# Patient Record
Sex: Female | Born: 1970 | Race: White | Hispanic: No | Marital: Married | Smoking: Former smoker
Health system: Southern US, Community
[De-identification: ages and names within clinical notes are randomized; demographics above are authoritative.]

## PROBLEM LIST (undated history)

## (undated) DIAGNOSIS — J4520 Mild intermittent asthma, uncomplicated: Secondary | ICD-10-CM

## (undated) DIAGNOSIS — F419 Anxiety disorder, unspecified: Secondary | ICD-10-CM

## (undated) HISTORY — DX: Mild intermittent asthma, uncomplicated: J45.20

## (undated) HISTORY — DX: Anxiety disorder, unspecified: F41.9

---

## 2001-03-23 ENCOUNTER — Encounter: Payer: Self-pay | Admitting: Family Medicine

## 2001-03-23 LAB — CONVERTED CEMR LAB

## 2003-11-24 HISTORY — PX: ABDOMINAL HYSTERECTOMY: SHX81

## 2004-11-28 ENCOUNTER — Ambulatory Visit: Payer: Self-pay | Admitting: Family Medicine

## 2004-12-19 ENCOUNTER — Ambulatory Visit: Payer: Self-pay | Admitting: Family Medicine

## 2005-09-15 ENCOUNTER — Ambulatory Visit: Payer: Self-pay | Admitting: Professional

## 2005-09-22 ENCOUNTER — Ambulatory Visit: Payer: Self-pay | Admitting: Professional

## 2006-03-18 ENCOUNTER — Ambulatory Visit: Payer: Self-pay | Admitting: Family Medicine

## 2006-04-08 ENCOUNTER — Ambulatory Visit: Payer: Self-pay | Admitting: Obstetrics & Gynecology

## 2006-11-30 ENCOUNTER — Ambulatory Visit: Payer: Self-pay | Admitting: Family Medicine

## 2006-12-02 ENCOUNTER — Encounter: Payer: Self-pay | Admitting: Family Medicine

## 2006-12-02 LAB — CONVERTED CEMR LAB: Glucose, Bld: 85 mg/dL (ref 70–99)

## 2007-12-01 ENCOUNTER — Encounter: Payer: Self-pay | Admitting: Family Medicine

## 2007-12-01 DIAGNOSIS — F411 Generalized anxiety disorder: Secondary | ICD-10-CM | POA: Insufficient documentation

## 2007-12-01 DIAGNOSIS — N3090 Cystitis, unspecified without hematuria: Secondary | ICD-10-CM

## 2007-12-01 DIAGNOSIS — N390 Urinary tract infection, site not specified: Secondary | ICD-10-CM | POA: Insufficient documentation

## 2007-12-02 ENCOUNTER — Ambulatory Visit: Payer: Self-pay | Admitting: Family Medicine

## 2008-02-29 ENCOUNTER — Ambulatory Visit: Payer: Self-pay | Admitting: Family Medicine

## 2008-03-28 ENCOUNTER — Encounter: Payer: Self-pay | Admitting: Family Medicine

## 2008-08-02 ENCOUNTER — Ambulatory Visit: Payer: Self-pay | Admitting: Gynecology

## 2008-09-03 ENCOUNTER — Ambulatory Visit: Payer: Self-pay | Admitting: Family Medicine

## 2008-10-11 ENCOUNTER — Telehealth: Payer: Self-pay | Admitting: Family Medicine

## 2008-10-12 ENCOUNTER — Ambulatory Visit: Payer: Self-pay | Admitting: Family Medicine

## 2008-12-10 ENCOUNTER — Ambulatory Visit: Payer: Self-pay | Admitting: Family Medicine

## 2009-07-08 ENCOUNTER — Ambulatory Visit: Payer: Self-pay | Admitting: Family Medicine

## 2009-08-06 ENCOUNTER — Emergency Department: Payer: Self-pay | Admitting: Unknown Physician Specialty

## 2010-08-11 ENCOUNTER — Encounter: Payer: Self-pay | Admitting: Family Medicine

## 2010-12-23 NOTE — Letter (Signed)
Summary: [facility] [employer]  [employer] [employer]   Imported By: Lanelle Bal 08/19/2010 08:40:46  _____________________________________________________________________  External Attachment:    Type:   Image     Comment:   External Document

## 2011-04-07 NOTE — Assessment & Plan Note (Signed)
NAME:  Rachel Caldwell, Rachel Caldwell NO.:  [id number]   MEDICAL RECORD NO.:  [id number]          PATIENT TYPE:  POB   LOCATION:  [facility] at [facility]         FACILITY:  [facility]   PHYSICIAN:  Ginger Carne, MD DATE OF BIRTH:  06-23-1971   DATE OF SERVICE:                                  CLINIC NOTE   This patient is here today because of complaint maculopapular irritation  and lesions on her lower pelvic area and vulva following using razor  blade to shave down in that region.  The patient is a 40 year old  Caucasian female who typically uses a razor for said hygiene management.   SALIENT PHYSICAL FINDINGS:  The patient demonstrates multiple areas of  papular lesions consistent with folliculitis in the pelvic and vulvar  areas.   IMPRESSION AND PLAN:  Folliculitis.   PLAN:  Bactrim double strength 1 twice a day #20.  The patient was  advised to return if these symptoms disappear.  There is no suggestion  or evidence for herpes simplex virus.           ______________________________  Ginger Carne, MD     SHB/MEDQ  D:  08/02/2008  T:  08/03/2008  Job:  [id number]

## 2011-04-10 NOTE — Group Therapy Note (Signed)
NAME:  Rachel Caldwell, Rachel Caldwell NO.:  [id number]   MEDICAL RECORD NO.:  [id number]          PATIENT TYPE:  WOC   LOCATION:  [facility]                   FACILITY:  [facility]   PHYSICIAN:  Tinnie Gens, MD        DATE OF BIRTH:  Sep 13, 1971   DATE OF SERVICE:  04/08/2006                                    CLINIC NOTE   CHIEF COMPLAINT:  Lesions on vulva.   HISTORY OF PRESENT ILLNESS:  The patient is a 40 year old nulligravid  patient, who is status post TVH for menorrhagia in June 2005. The patient  apparently split up with her husband approximately 3 months ago and  developed some lesions on her vulvar area. She was seen by Dr. Rosezena Sensor,  who sent her here for further evaluation and possible treatment. She reports  that the lesions are not painful. There is no irritation involved. They do  not itch. Some of the lesions have kind of gone away. She did notice a white  spot in one of them that she pulled out and then the lesion kind of went  away after that.   PHYSICAL EXAMINATION:  VITAL SIGNS:  Blood pressure 119/85, weight 126,  pulse of 76.  GENERAL:  She is a well-developed, well-nourished white female in no acute  distress.  ABDOMEN:  Soft, nontender, and nondistended.  GENITOURINARY:  All along the vulva are multiple papules. Some with dimples.  They are mildly erythematous. Some are larger than others and they are in  various stages of healing.   IMPRESSION:  Molluscum contagiosum.   PLAN:  Given that the patient is not immunosuppressed and these lesions are  not bothering her, would not treat them at this time. Molluscum almost  always is a self-limited disease and will go away by itself. I have  scheduled her followup in 3 months to be sure that they are going in the  right direction. Reviewed safe sex practices.           ______________________________  Tinnie Gens, MD     TP/MEDQ  D:  04/08/2006  T:  04/09/2006  Job:  [id number]

## 2014-05-11 DIAGNOSIS — J4520 Mild intermittent asthma, uncomplicated: Secondary | ICD-10-CM | POA: Insufficient documentation

## 2015-08-15 DIAGNOSIS — M7541 Impingement syndrome of right shoulder: Secondary | ICD-10-CM | POA: Insufficient documentation

## 2015-11-04 ENCOUNTER — Encounter: Payer: Self-pay | Admitting: Family Medicine

## 2015-11-04 ENCOUNTER — Ambulatory Visit (INDEPENDENT_AMBULATORY_CARE_PROVIDER_SITE_OTHER): Payer: BLUE CROSS/BLUE SHIELD | Admitting: Family Medicine

## 2015-11-04 VITALS — BP 102/68 | HR 96 | Temp 99.0°F | Resp 15 | Ht 68.0 in | Wt 135.9 lb

## 2015-11-04 DIAGNOSIS — F411 Generalized anxiety disorder: Secondary | ICD-10-CM

## 2015-11-04 MED ORDER — ALPRAZOLAM 0.25 MG PO TABS: 0.2500 mg | ORAL_TABLET | Freq: Every day | ORAL | Status: DC | PRN

## 2015-11-04 MED ORDER — CITALOPRAM HYDROBROMIDE 10 MG PO TABS: 10.0000 mg | ORAL_TABLET | Freq: Every day | ORAL | Status: DC

## 2015-11-04 NOTE — Progress Notes (Signed)
Name: Rachel Caldwell   MRN: [id number]    DOB: October 01, 1971   Date:11/04/2015       Progress Note  Subjective  Chief Complaint  Chief Complaint  Patient presents with  . Establish Care    NP  . Anxiety    Anxiety Presents for initial visit. The problem has been rapidly worsening. Symptoms include chest pain, excessive worry, insomnia, irritability, muscle tension, nervous/anxious behavior, panic and shortness of breath. Patient reports no depressed mood, palpitations or suicidal ideas. Primary symptoms comment: Symptoms described as being 'uptight, trouble relaxing, feeling overwhelmed'. Symptoms occur most days. The severity of symptoms is moderate.   Her past medical history is significant for anxiety/panic attacks. Past treatments include SSRIs (In the past, has been on Lexapro and Paxil. ). Compliance with prior treatments: Was prescribed Lexapro by PCP in past but never started taking.   Past Medical History  Diagnosis Date  . Anxiety     Past Surgical History  Procedure Laterality Date  . Abdominal hysterectomy  2005    History reviewed. No pertinent family history.  Social History   Social History  . Marital Status: Single    Spouse Name: N/A  . Number of Children: N/A  . Years of Education: N/A   Occupational History  . Not on file.   Social History Main Topics  . Smoking status: Never Smoker   . Smokeless tobacco: Never Used  . Alcohol Use: No  . Drug Use: No  . Sexual Activity: No   Other Topics Concern  . Not on file   Social History Narrative  . No narrative on file     Current outpatient prescriptions:  .  albuterol (PROVENTIL HFA;VENTOLIN HFA) 108 (90 BASE) MCG/ACT inhaler, Inhale into the lungs., Disp: , Rfl:  .  nitrofurantoin (MACRODANTIN) 100 MG capsule, Take 100 mg by mouth as needed., Disp: , Rfl:   No Known Allergies   Review of Systems  Constitutional: Positive for irritability.  Eyes: Negative for blurred vision and double vision.   Respiratory: Positive for shortness of breath.   Cardiovascular: Positive for chest pain. Negative for palpitations.  Gastrointestinal: Negative for abdominal pain.  Neurological: Negative for headaches.  Psychiatric/Behavioral: Negative for depression, suicidal ideas and substance abuse. The patient is nervous/anxious and has insomnia.     Objective  Filed Vitals:   11/04/15 0851  BP: 102/68  Pulse: 96  Temp: 99 F (37.2 C)  TempSrc: Oral  Resp: 15  Height: 5\' 8"  (1.727 m)  Weight: 135 lb 14.4 oz (61.644 kg)  SpO2: 98%    Physical Exam  Constitutional: She is oriented to person, place, and time and well-developed, well-nourished, and in no distress.  Cardiovascular: Normal rate, regular rhythm and normal heart sounds.   No murmur heard. Pulmonary/Chest: Effort normal and breath sounds normal. She has no wheezes.  Abdominal: Soft. Bowel sounds are normal. There is no tenderness.  Neurological: She is alert and oriented to person, place, and time.  Psychiatric: Mood, memory, affect and judgment normal.  Nursing note and vitals reviewed.   Assessment & Plan  1. Generalized anxiety disorder We will start on SSRI and benzodiazepine therapy. Advised of potential side effects and dependence potential of benzodiazepines. Recheck in 6 weeks. - citalopram (CELEXA) 10 MG tablet; Take 1 tablet (10 mg total) by mouth daily.  Dispense: 30 tablet; Refill: 2 - ALPRAZolam (XANAX) 0.25 MG tablet; Take 1 tablet (0.25 mg total) by mouth daily as needed for anxiety.  Dispense:  30 tablet; Refill: 2   Jacobie Stamey [facility] 11/04/2015 9:27 AM

## 2015-12-05 ENCOUNTER — Ambulatory Visit: Payer: BLUE CROSS/BLUE SHIELD | Admitting: Family Medicine

## 2016-01-03 ENCOUNTER — Telehealth: Payer: Self-pay | Admitting: Family Medicine

## 2016-01-03 NOTE — Telephone Encounter (Signed)
SHE DOES NOT NEED REFILLS FOR SHE CAN GET THIS RX BUT WILL ONLY GET IT FOR 30 DAYS AND HAS TO PAY 10.00 MORE DOLLARS THAN SHE USUALLY HAS TO PAY FOR 90 DAY SUPPLY. HER INSURACNE COMPANY JUST ASK THAT THE RX BE WRITTEN FOR 90 DAYS INSTEAD OF 30 DAYS. SHE STILL HAS REFILLS BUT JUST NEEDS IT TO BE FOR 90 DAYS.  DOES SHE HAVE TO WAIT TILL HER NEXT APPT BEFORE THIS CAN BE CHANGED?

## 2016-01-03 NOTE — Telephone Encounter (Signed)
lft mess on cell to make appt . Per Dr this has to be done before rx can be changed to 90 day supply.

## 2016-01-03 NOTE — Telephone Encounter (Signed)
Pt said that she already has appt on 01-13-16 and she will just wait till then

## 2016-01-03 NOTE — Telephone Encounter (Signed)
CELEXA FOR HER ANXIETY THE PHARM ([facility] ON [address]) SAYS THAT THE INS IS REQUESTING A 90 DAY SUPPLY.

## 2016-01-03 NOTE — Telephone Encounter (Signed)
Pt was a new pt and has only seen Manuella Ghazi once. Pt was supposed to come back in 6 weeks (12/05/15) for follow up so pt needs to make appt for refills.

## 2016-01-03 NOTE — Telephone Encounter (Signed)
Yes she does

## 2016-01-13 ENCOUNTER — Encounter: Payer: Self-pay | Admitting: Family Medicine

## 2016-01-13 ENCOUNTER — Ambulatory Visit (INDEPENDENT_AMBULATORY_CARE_PROVIDER_SITE_OTHER): Payer: BLUE CROSS/BLUE SHIELD | Admitting: Family Medicine

## 2016-01-13 VITALS — BP 100/78 | HR 89 | Temp 99.3°F | Resp 19 | Ht 68.0 in | Wt 135.1 lb

## 2016-01-13 DIAGNOSIS — F411 Generalized anxiety disorder: Secondary | ICD-10-CM | POA: Diagnosis not present

## 2016-01-13 DIAGNOSIS — Z23 Encounter for immunization: Secondary | ICD-10-CM | POA: Diagnosis not present

## 2016-01-13 MED ORDER — CITALOPRAM HYDROBROMIDE 10 MG PO TABS: 10.0000 mg | ORAL_TABLET | Freq: Every day | ORAL | Status: DC

## 2016-01-13 MED ORDER — ALPRAZOLAM 0.5 MG PO TABS: 0.5000 mg | ORAL_TABLET | Freq: Every day | ORAL | Status: DC | PRN

## 2016-01-13 NOTE — Progress Notes (Signed)
Name: Rachel Caldwell   MRN: [id number]    DOB: [birth year]   Date:01/13/2016       Progress Note  Subjective  Chief Complaint  Chief Complaint  Patient presents with  . Follow-up    1 mo  . Anxiety  . Medication Refill    xanax / celexa 10 mg     Anxiety Presents for initial visit. The problem has been gradually improving. Symptoms include chest pain, excessive worry, insomnia, irritability, muscle tension, nervous/anxious behavior, panic and shortness of breath. Patient reports no depressed mood, palpitations or suicidal ideas. Symptoms occur most days. The severity of symptoms is moderate.   Her past medical history is significant for anxiety/panic attacks. Past treatments include SSRIs (In the past, has been on Lexapro and Paxil. ). The treatment provided moderate (Still experiences breakthrough symptoms of anxiety but thinks 'thats just me') relief. Compliance with prior treatments has been good (Was prescribed Lexapro by PCP in past but never started taking).      Past Medical History  Diagnosis Date  . Anxiety     Past Surgical History  Procedure Laterality Date  . Abdominal hysterectomy  2005    Family History  Problem Relation Age of Onset  . Healthy Mother   . Healthy Father     Social History   Social History  . Marital Status: Single    Spouse Name: N/A  . Number of Children: N/A  . Years of Education: N/A   Occupational History  . Not on file.   Social History Main Topics  . Smoking status: Former Smoker    Quit date: 11/03/2012  . Smokeless tobacco: Never Used  . Alcohol Use: No  . Drug Use: No  . Sexual Activity:    Partners: Male   Other Topics Concern  . Not on file   Social History Narrative     Current outpatient prescriptions:  .  albuterol (PROVENTIL HFA;VENTOLIN HFA) 108 (90 BASE) MCG/ACT inhaler, Inhale into the lungs., Disp: , Rfl:  .  ALPRAZolam (XANAX) 0.25 MG tablet, Take 1 tablet (0.25 mg total) by mouth daily as needed for  anxiety., Disp: 30 tablet, Rfl: 2 .  citalopram (CELEXA) 10 MG tablet, Take 1 tablet (10 mg total) by mouth daily., Disp: 30 tablet, Rfl: 2 .  nitrofurantoin (MACRODANTIN) 100 MG capsule, Take 100 mg by mouth as needed., Disp: , Rfl:   No Known Allergies   Review of Systems  Constitutional: Positive for irritability.  Respiratory: Positive for shortness of breath.   Cardiovascular: Positive for chest pain. Negative for palpitations.  Psychiatric/Behavioral: Negative for suicidal ideas. The patient is nervous/anxious and has insomnia.      Objective  Filed Vitals:   01/13/16 0930  BP: 100/78  Pulse: 89  Temp: 99.3 F (37.4 C)  TempSrc: Oral  Resp: 19  Height: 5\' 8"  (1.727 m)  Weight: 135 lb 1.6 oz (61.281 kg)  SpO2: 97%    Physical Exam  Constitutional: She is oriented to person, place, and time and well-developed, well-nourished, and in no distress.  Cardiovascular: Normal rate and regular rhythm.   Pulmonary/Chest: Effort normal and breath sounds normal.  Neurological: She is alert and oriented to person, place, and time.  Psychiatric: Mood, memory, affect and judgment normal.  Nursing note and vitals reviewed.    Assessment & Plan  1. Need for immunization against influenza  - Flu Vaccine QUAD 36+ mos PF IM (Fluarix & Fluzone Quad PF)  2. Generalized anxiety disorder  We will increase alprazolam from 0.25 mg to 0.5 mg daily as needed for anxiety. Continue on SSRI therapy. Recheck in 3 months. Refills provided - ALPRAZolam (XANAX) 0.5 MG tablet; Take 1 tablet (0.5 mg total) by mouth daily as needed for anxiety.  Dispense: 90 tablet; Refill: 0 - citalopram (CELEXA) 10 MG tablet; Take 1 tablet (10 mg total) by mouth daily.  Dispense: 90 tablet; Refill: 0   Marisue Canion [facility] 01/13/2016 9:54 AM

## 2016-02-24 ENCOUNTER — Encounter: Payer: BLUE CROSS/BLUE SHIELD | Admitting: Family Medicine

## 2016-03-11 DIAGNOSIS — L578 Other skin changes due to chronic exposure to nonionizing radiation: Secondary | ICD-10-CM | POA: Diagnosis not present

## 2016-03-11 DIAGNOSIS — L700 Acne vulgaris: Secondary | ICD-10-CM | POA: Diagnosis not present

## 2016-03-19 ENCOUNTER — Encounter: Payer: BLUE CROSS/BLUE SHIELD | Admitting: Family Medicine

## 2016-04-10 DIAGNOSIS — Z6820 Body mass index (BMI) 20.0-20.9, adult: Secondary | ICD-10-CM | POA: Diagnosis not present

## 2016-04-10 DIAGNOSIS — N23 Unspecified renal colic: Secondary | ICD-10-CM | POA: Diagnosis not present

## 2016-04-10 DIAGNOSIS — R339 Retention of urine, unspecified: Secondary | ICD-10-CM | POA: Diagnosis not present

## 2016-04-10 DIAGNOSIS — N3020 Other chronic cystitis without hematuria: Secondary | ICD-10-CM | POA: Diagnosis not present

## 2016-04-16 DIAGNOSIS — R339 Retention of urine, unspecified: Secondary | ICD-10-CM | POA: Diagnosis not present

## 2016-04-16 DIAGNOSIS — N3020 Other chronic cystitis without hematuria: Secondary | ICD-10-CM | POA: Diagnosis not present

## 2016-04-16 DIAGNOSIS — N23 Unspecified renal colic: Secondary | ICD-10-CM | POA: Diagnosis not present

## 2016-05-04 ENCOUNTER — Other Ambulatory Visit: Payer: Self-pay

## 2016-05-04 ENCOUNTER — Ambulatory Visit (INDEPENDENT_AMBULATORY_CARE_PROVIDER_SITE_OTHER): Payer: BLUE CROSS/BLUE SHIELD | Admitting: Family Medicine

## 2016-05-04 ENCOUNTER — Encounter: Payer: Self-pay | Admitting: Family Medicine

## 2016-05-04 VITALS — BP 100/70 | HR 68 | Temp 98.0°F | Resp 15 | Ht 68.0 in | Wt 140.0 lb

## 2016-05-04 DIAGNOSIS — Z0000 Encounter for general adult medical examination without abnormal findings: Secondary | ICD-10-CM

## 2016-05-04 DIAGNOSIS — F411 Generalized anxiety disorder: Secondary | ICD-10-CM

## 2016-05-04 MED ORDER — ALPRAZOLAM 0.5 MG PO TABS: 0.5000 mg | ORAL_TABLET | Freq: Every day | ORAL | Status: DC | PRN

## 2016-05-04 MED ORDER — CITALOPRAM HYDROBROMIDE 10 MG PO TABS: 10.0000 mg | ORAL_TABLET | Freq: Every day | ORAL | Status: DC

## 2016-05-04 MED ORDER — DOXYCYCLINE HYCLATE 100 MG PO TABS: 100.0000 mg | ORAL_TABLET | Freq: Every day | ORAL | Status: DC

## 2016-05-04 NOTE — Progress Notes (Signed)
Name: Rachel Caldwell   MRN: [id number]    DOB: 1971-06-27   Date:05/04/2016       Progress Note  Subjective  Chief Complaint  Chief Complaint  Patient presents with  . Annual Exam    CPE    Anxiety Presents for follow-up visit. The problem has been gradually improving. Symptoms include excessive worry and nervous/anxious behavior. Patient reports no chest pain, depressed mood, dizziness, insomnia, nausea, panic (no recent panic attacks.) or shortness of breath. The severity of symptoms is moderate.   Past treatments include SSRIs and benzodiazephines. The treatment provided significant relief. Compliance with prior treatments has been good.    Pt. Presents for a Complete Physical Exam.  She had a partial hysterectomy in 2005, is not required to have Pap Smears.  Never had a colonoscopy or Mammogram.   Past Medical History  Diagnosis Date  . Anxiety     Past Surgical History  Procedure Laterality Date  . Abdominal hysterectomy  2005    Family History  Problem Relation Age of Onset  . Healthy Mother   . Healthy Father     Social History   Social History  . Marital Status: Single    Spouse Name: N/A  . Number of Children: N/A  . Years of Education: N/A   Occupational History  . Not on file.   Social History Main Topics  . Smoking status: Former Smoker    Quit date: 11/03/2012  . Smokeless tobacco: Never Used  . Alcohol Use: No  . Drug Use: No  . Sexual Activity:    Partners: Male   Other Topics Concern  . Not on file   Social History Narrative     Current outpatient prescriptions:  .  albuterol (PROVENTIL HFA;VENTOLIN HFA) 108 (90 BASE) MCG/ACT inhaler, Inhale into the lungs., Disp: , Rfl:  .  ALPRAZolam (XANAX) 0.5 MG tablet, Take 1 tablet (0.5 mg total) by mouth daily as needed for anxiety., Disp: 90 tablet, Rfl: 0 .  citalopram (CELEXA) 10 MG tablet, Take 1 tablet (10 mg total) by mouth daily., Disp: 90 tablet, Rfl: 0 .  nitrofurantoin  (MACRODANTIN) 100 MG capsule, Take 100 mg by mouth as needed., Disp: , Rfl:  .  tiZANidine (ZANAFLEX) 4 MG tablet, take 1 tablet by mouth every 8 hours if needed, Disp: , Rfl: 0  No Known Allergies   Review of Systems  Constitutional: Negative for fever, chills, weight loss and malaise/fatigue.  Eyes: Negative for blurred vision and double vision.  Respiratory: Negative for cough and shortness of breath.   Cardiovascular: Negative for chest pain and leg swelling.  Gastrointestinal: Negative for heartburn, nausea, vomiting and blood in stool.  Genitourinary: Negative for dysuria, frequency and hematuria.  Musculoskeletal: Negative for myalgias, back pain and neck pain.  Skin: Negative for rash.  Neurological: Negative for dizziness and headaches.  Psychiatric/Behavioral: Negative for depression. The patient is nervous/anxious. The patient does not have insomnia.     Objective  Filed Vitals:   05/04/16 1104  BP: 100/70  Pulse: 68  Temp: 98 F (36.7 C)  TempSrc: Oral  Resp: 15  Height: 5\' 8"  (1.727 m)  Weight: 140 lb (63.504 kg)  SpO2: 96%    Physical Exam  Constitutional: She is oriented to person, place, and time and well-developed, well-nourished, and in no distress.  HENT:  Head: Normocephalic and atraumatic.  Eyes: Pupils are equal, round, and reactive to light.  Cardiovascular: Normal rate, regular rhythm and normal heart sounds.  No murmur heard. Pulmonary/Chest: Effort normal and breath sounds normal. She has no wheezes.  Abdominal: Soft. Bowel sounds are normal.  Neurological: She is alert and oriented to person, place, and time.  Skin: Skin is warm and dry.  Psychiatric: Mood, memory, affect and judgment normal.  Nursing note and vitals reviewed.      Assessment & Plan  1. Well woman exam (no gynecological exam) Obtain age appropriate laboratory screenings. - CBC with Differential - Comprehensive Metabolic Panel (CMET) - Lipid Profile - TSH -  Vitamin D (25 hydroxy)  2. Generalized anxiety disorder Able and improved. Refills on Alprazolam and Citalopram provided. - ALPRAZolam (XANAX) 0.5 MG tablet; Take 1 tablet (0.5 mg total) by mouth daily as needed for anxiety.  Dispense: 90 tablet; Refill: 0 - citalopram (CELEXA) 10 MG tablet; Take 1 tablet (10 mg total) by mouth daily.  Dispense: 90 tablet; Refill: 0   Fawaz Borquez [facility] 05/04/2016 11:17 AM

## 2016-05-05 LAB — COMPREHENSIVE METABOLIC PANEL
A/G RATIO: 1.7 (ref 1.2–2.2)
ALT: 11 IU/L (ref 0–32)
AST: 14 IU/L (ref 0–40)
Albumin: 4.3 g/dL (ref 3.5–5.5)
Alkaline Phosphatase: 67 IU/L (ref 39–117)
BUN/Creatinine Ratio: 12 (ref 9–23)
BUN: 10 mg/dL (ref 6–24)
Bilirubin Total: 0.3 mg/dL (ref 0.0–1.2)
CALCIUM: 9.4 mg/dL (ref 8.7–10.2)
CO2: 26 mmol/L (ref 18–29)
CREATININE: 0.83 mg/dL (ref 0.57–1.00)
Chloride: 100 mmol/L (ref 96–106)
GFR, EST AFRICAN AMERICAN: 99 mL/min/{1.73_m2} (ref >59)
GFR, EST NON AFRICAN AMERICAN: 86 mL/min/{1.73_m2} (ref >59)
GLOBULIN, TOTAL: 2.5 g/dL (ref 1.5–4.5)
Glucose: 81 mg/dL (ref 65–99)
POTASSIUM: 4.9 mmol/L (ref 3.5–5.2)
Sodium: 140 mmol/L (ref 134–144)
TOTAL PROTEIN: 6.8 g/dL (ref 6.0–8.5)

## 2016-05-05 LAB — CBC WITH DIFFERENTIAL/PLATELET
BASOS: 0 %
Basophils Absolute: 0 10*3/uL (ref 0.0–0.2)
EOS (ABSOLUTE): 0.1 10*3/uL (ref 0.0–0.4)
EOS: 1 %
HEMATOCRIT: 41.6 % (ref 34.0–46.6)
Hemoglobin: 13.9 g/dL (ref 11.1–15.9)
IMMATURE GRANS (ABS): 0 10*3/uL (ref 0.0–0.1)
IMMATURE GRANULOCYTES: 0 %
LYMPHS: 38 %
Lymphocytes Absolute: 2.5 10*3/uL (ref 0.7–3.1)
MCH: 31.4 pg (ref 26.6–33.0)
MCHC: 33.4 g/dL (ref 31.5–35.7)
MCV: 94 fL (ref 79–97)
Monocytes Absolute: 0.4 10*3/uL (ref 0.1–0.9)
Monocytes: 6 %
NEUTROS PCT: 55 %
Neutrophils Absolute: 3.6 10*3/uL (ref 1.4–7.0)
PLATELETS: 294 10*3/uL (ref 150–379)
RBC: 4.43 x10E6/uL (ref 3.77–5.28)
RDW: 13.6 % (ref 12.3–15.4)
WBC: 6.7 10*3/uL (ref 3.4–10.8)

## 2016-05-05 LAB — LIPID PANEL
CHOL/HDL RATIO: 2.9 ratio (ref 0.0–4.4)
Cholesterol, Total: 175 mg/dL (ref 100–199)
HDL: 60 mg/dL (ref >39)
LDL CALC: 85 mg/dL (ref 0–99)
TRIGLYCERIDES: 152 mg/dL — AB (ref 0–149)
VLDL Cholesterol Cal: 30 mg/dL (ref 5–40)

## 2016-05-05 LAB — TSH: TSH: 2.46 u[IU]/mL (ref 0.450–4.500)

## 2016-05-05 LAB — VITAMIN D 25 HYDROXY (VIT D DEFICIENCY, FRACTURES): Vit D, 25-Hydroxy: 46.4 ng/mL (ref 30.0–100.0)

## 2016-05-18 DIAGNOSIS — L700 Acne vulgaris: Secondary | ICD-10-CM | POA: Diagnosis not present

## 2016-05-18 DIAGNOSIS — Z79899 Other long term (current) drug therapy: Secondary | ICD-10-CM | POA: Diagnosis not present

## 2016-05-18 DIAGNOSIS — L578 Other skin changes due to chronic exposure to nonionizing radiation: Secondary | ICD-10-CM | POA: Diagnosis not present

## 2016-06-19 DIAGNOSIS — R339 Retention of urine, unspecified: Secondary | ICD-10-CM | POA: Diagnosis not present

## 2016-06-19 DIAGNOSIS — N3020 Other chronic cystitis without hematuria: Secondary | ICD-10-CM | POA: Diagnosis not present

## 2016-06-19 DIAGNOSIS — N23 Unspecified renal colic: Secondary | ICD-10-CM | POA: Diagnosis not present

## 2016-06-19 DIAGNOSIS — R3129 Other microscopic hematuria: Secondary | ICD-10-CM | POA: Diagnosis not present

## 2016-07-08 DIAGNOSIS — N3021 Other chronic cystitis with hematuria: Secondary | ICD-10-CM | POA: Diagnosis not present

## 2016-07-08 DIAGNOSIS — R3129 Other microscopic hematuria: Secondary | ICD-10-CM | POA: Diagnosis not present

## 2016-07-08 DIAGNOSIS — N23 Unspecified renal colic: Secondary | ICD-10-CM | POA: Diagnosis not present

## 2016-07-28 ENCOUNTER — Encounter: Payer: Self-pay | Admitting: Family Medicine

## 2016-07-28 ENCOUNTER — Ambulatory Visit (INDEPENDENT_AMBULATORY_CARE_PROVIDER_SITE_OTHER): Payer: BLUE CROSS/BLUE SHIELD | Admitting: Family Medicine

## 2016-07-28 DIAGNOSIS — F411 Generalized anxiety disorder: Secondary | ICD-10-CM | POA: Diagnosis not present

## 2016-07-28 MED ORDER — CITALOPRAM HYDROBROMIDE 10 MG PO TABS: 10.0000 mg | ORAL_TABLET | Freq: Every day | ORAL | 0 refills | Status: DC

## 2016-07-28 MED ORDER — ALPRAZOLAM 0.5 MG PO TABS: 0.5000 mg | ORAL_TABLET | Freq: Every day | ORAL | 0 refills | Status: DC | PRN

## 2016-07-28 NOTE — Progress Notes (Signed)
Name: Rachel Caldwell   MRN: [id number]    DOB: 1971/03/23   Date:07/28/2016       Progress Note  Subjective  Chief Complaint  Chief Complaint  Patient presents with  . Anxiety    pt here for 3 month follow up    Anxiety  Presents for follow-up visit. Symptoms include dizziness, excessive worry, insomnia, nervous/anxious behavior, panic and shortness of breath. The severity of symptoms is moderate and causing significant distress. The quality of sleep is good.     Past Medical History:  Diagnosis Date  . Anxiety     Past Surgical History:  Procedure Laterality Date  . ABDOMINAL HYSTERECTOMY  2005    Family History  Problem Relation Age of Onset  . Healthy Mother   . Healthy Father     Social History   Social History  . Marital status: Single    Spouse name: N/A  . Number of children: N/A  . Years of education: N/A   Occupational History  . Not on file.   Social History Main Topics  . Smoking status: Former Smoker    Quit date: 11/03/2012  . Smokeless tobacco: Never Used  . Alcohol use No  . Drug use: No  . Sexual activity: Yes    Partners: Male   Other Topics Concern  . Not on file   Social History Narrative  . No narrative on file     Current Outpatient Prescriptions:  .  albuterol (PROVENTIL HFA;VENTOLIN HFA) 108 (90 BASE) MCG/ACT inhaler, Inhale into the lungs., Disp: , Rfl:  .  ALPRAZolam (XANAX) 0.5 MG tablet, Take 1 tablet (0.5 mg total) by mouth daily as needed for anxiety., Disp: 90 tablet, Rfl: 0 .  citalopram (CELEXA) 10 MG tablet, Take 1 tablet (10 mg total) by mouth daily., Disp: 90 tablet, Rfl: 0 .  doxycycline (VIBRA-TABS) 100 MG tablet, Take 1 tablet (100 mg total) by mouth daily. (Patient not taking: Reported on 07/28/2016), Disp: 20 tablet, Rfl: 0 .  nitrofurantoin (MACRODANTIN) 100 MG capsule, Take 100 mg by mouth as needed., Disp: , Rfl:  .  tiZANidine (ZANAFLEX) 4 MG tablet, take 1 tablet by mouth every 8 hours if needed, Disp: , Rfl:  0  No Known Allergies   Review of Systems  Respiratory: Positive for shortness of breath.   Neurological: Positive for dizziness.  Psychiatric/Behavioral: Negative for depression. The patient is nervous/anxious and has insomnia.     Objective  Vitals:   07/28/16 1524  BP: 100/64  Pulse: 82  Resp: 18  Temp: 98.6 F (37 C)  SpO2: 97%  Weight: 144 lb 6 oz (65.5 kg)  Height: 5\' 8"  (1.727 m)    Physical Exam  Constitutional: She is oriented to person, place, and time and well-developed, well-nourished, and in no distress.  HENT:  Head: Normocephalic and atraumatic.  Cardiovascular: Normal rate, regular rhythm, S1 normal, S2 normal and normal heart sounds.   No murmur heard. Pulmonary/Chest: Effort normal and breath sounds normal. She has no wheezes.  Neurological: She is alert and oriented to person, place, and time.  Psychiatric: Mood, memory, affect and judgment normal.  Nursing note and vitals reviewed.    Assessment & Plan  1. Generalized anxiety disorder Stable, alprazolam taken daily as needed, Celexa 10 mg daily. Refills provided - ALPRAZolam (XANAX) 0.5 MG tablet; Take 1 tablet (0.5 mg total) by mouth daily as needed for anxiety.  Dispense: 90 tablet; Refill: 0 - citalopram (CELEXA) 10 MG tablet;  Take 1 tablet (10 mg total) by mouth daily.  Dispense: 90 tablet; Refill: 0   Jandiel Magallanes [facility] 07/28/2016 3:41 PM

## 2016-08-04 ENCOUNTER — Ambulatory Visit: Payer: BLUE CROSS/BLUE SHIELD | Admitting: Family Medicine

## 2016-09-02 DIAGNOSIS — R3129 Other microscopic hematuria: Secondary | ICD-10-CM | POA: Diagnosis not present

## 2016-11-05 ENCOUNTER — Ambulatory Visit (INDEPENDENT_AMBULATORY_CARE_PROVIDER_SITE_OTHER): Payer: BLUE CROSS/BLUE SHIELD | Admitting: Family Medicine

## 2016-11-05 ENCOUNTER — Encounter: Payer: Self-pay | Admitting: Family Medicine

## 2016-11-05 DIAGNOSIS — F411 Generalized anxiety disorder: Secondary | ICD-10-CM

## 2016-11-05 MED ORDER — ALPRAZOLAM 0.5 MG PO TABS: 0.5000 mg | ORAL_TABLET | Freq: Every day | ORAL | 0 refills | Status: DC | PRN

## 2016-11-05 MED ORDER — CITALOPRAM HYDROBROMIDE 10 MG PO TABS: 10.0000 mg | ORAL_TABLET | Freq: Every day | ORAL | 0 refills | Status: DC

## 2016-11-05 NOTE — Progress Notes (Signed)
Name: Rachel Caldwell   MRN: [id number]    DOB: Aug 12, 1971   Date:11/05/2016       Progress Note  Subjective  Chief Complaint  Chief Complaint  Patient presents with  . Follow-up    medication refills    Anxiety  Presents for follow-up visit. Symptoms include dizziness, excessive worry, insomnia, nervous/anxious behavior, panic and shortness of breath. The severity of symptoms is moderate and causing significant distress. The quality of sleep is good.        Past Medical History:  Diagnosis Date  . Anxiety     Past Surgical History:  Procedure Laterality Date  . ABDOMINAL HYSTERECTOMY  2005    Family History  Problem Relation Age of Onset  . Healthy Mother   . Healthy Father     Social History   Social History  . Marital status: Single    Spouse name: N/A  . Number of children: N/A  . Years of education: N/A   Occupational History  . Not on file.   Social History Main Topics  . Smoking status: Former Smoker    Quit date: 11/03/2012  . Smokeless tobacco: Never Used  . Alcohol use No  . Drug use: No  . Sexual activity: Yes    Partners: Male   Other Topics Concern  . Not on file   Social History Narrative  . No narrative on file     Current Outpatient Prescriptions:  .  albuterol (PROVENTIL HFA;VENTOLIN HFA) 108 (90 BASE) MCG/ACT inhaler, Inhale into the lungs., Disp: , Rfl:  .  ALPRAZolam (XANAX) 0.5 MG tablet, Take 1 tablet (0.5 mg total) by mouth daily as needed for anxiety., Disp: 90 tablet, Rfl: 0 .  citalopram (CELEXA) 10 MG tablet, Take 1 tablet (10 mg total) by mouth daily., Disp: 90 tablet, Rfl: 0 .  doxycycline (VIBRA-TABS) 100 MG tablet, Take 1 tablet (100 mg total) by mouth daily., Disp: 20 tablet, Rfl: 0 .  nitrofurantoin (MACRODANTIN) 100 MG capsule, Take 100 mg by mouth as needed., Disp: , Rfl:  .  tiZANidine (ZANAFLEX) 4 MG tablet, take 1 tablet by mouth every 8 hours if needed, Disp: , Rfl: 0  No Known Allergies   Review of  Systems  Respiratory: Positive for shortness of breath.   Neurological: Positive for dizziness.  Psychiatric/Behavioral: The patient is nervous/anxious and has insomnia.       Objective  Vitals:   11/05/16 1600  BP: 122/70  Pulse: (!) 103  Resp: 16  Temp: 98 F (36.7 C)  TempSrc: Oral  SpO2: 97%  Weight: 142 lb 4.8 oz (64.5 kg)  Height: 5\' 8"  (1.727 m)    Physical Exam  Constitutional: She is oriented to person, place, and time and well-developed, well-nourished, and in no distress.  HENT:  Head: Normocephalic and atraumatic.  Cardiovascular: Normal rate, regular rhythm, S1 normal, S2 normal and normal heart sounds.   No murmur heard. Pulmonary/Chest: Effort normal and breath sounds normal. She has no wheezes.  Neurological: She is alert and oriented to person, place, and time.  Psychiatric: Mood, memory, affect and judgment normal.  Nursing note and vitals reviewed.      Assessment & Plan  1. Generalized anxiety disorder Symptoms stable and responsive to alprazolam taken daily as needed and citalopram taken every day. Refills provided - citalopram (CELEXA) 10 MG tablet; Take 1 tablet (10 mg total) by mouth daily.  Dispense: 90 tablet; Refill: 0 - ALPRAZolam (XANAX) 0.5 MG tablet; Take 1  tablet (0.5 mg total) by mouth daily as needed for anxiety.  Dispense: 90 tablet; Refill: 0   Jamieson Hetland [facility] 11/05/2016 4:24 PM

## 2016-11-09 ENCOUNTER — Ambulatory Visit: Payer: BLUE CROSS/BLUE SHIELD | Admitting: Family Medicine

## 2017-01-11 DIAGNOSIS — L700 Acne vulgaris: Secondary | ICD-10-CM | POA: Diagnosis not present

## 2017-01-11 DIAGNOSIS — Z79899 Other long term (current) drug therapy: Secondary | ICD-10-CM | POA: Diagnosis not present

## 2017-02-03 ENCOUNTER — Ambulatory Visit (INDEPENDENT_AMBULATORY_CARE_PROVIDER_SITE_OTHER): Payer: BLUE CROSS/BLUE SHIELD | Admitting: Family Medicine

## 2017-02-03 DIAGNOSIS — F411 Generalized anxiety disorder: Secondary | ICD-10-CM | POA: Diagnosis not present

## 2017-02-03 MED ORDER — CITALOPRAM HYDROBROMIDE 10 MG PO TABS: 10.0000 mg | ORAL_TABLET | Freq: Every day | ORAL | 0 refills | Status: DC

## 2017-02-03 MED ORDER — ALPRAZOLAM 0.5 MG PO TABS: 0.5000 mg | ORAL_TABLET | Freq: Every day | ORAL | 0 refills | Status: DC | PRN

## 2017-02-03 NOTE — Progress Notes (Signed)
Name: Rachel Caldwell   MRN: [id number]    DOB: Mar 08, 1971   Date:02/03/2017       Progress Note  Subjective  Chief Complaint  Chief Complaint  Patient presents with  . Medication Refill    Anxiety  Presents for follow-up visit. Symptoms include dizziness, excessive worry, insomnia, nervous/anxious behavior, panic and shortness of breath. Patient reports no chest pain, depressed mood or suicidal ideas. The severity of symptoms is moderate. The quality of sleep is good.      Past Medical History:  Diagnosis Date  . Anxiety     Past Surgical History:  Procedure Laterality Date  . ABDOMINAL HYSTERECTOMY  2005    Family History  Problem Relation Age of Onset  . Healthy Mother   . Healthy Father     Social History   Social History  . Marital status: Single    Spouse name: N/A  . Number of children: N/A  . Years of education: N/A   Occupational History  . Not on file.   Social History Main Topics  . Smoking status: Former Smoker    Quit date: 11/03/2012  . Smokeless tobacco: Never Used  . Alcohol use No  . Drug use: No  . Sexual activity: Yes    Partners: Male   Other Topics Concern  . Not on file   Social History Narrative  . No narrative on file     Current Outpatient Prescriptions:  .  ALPRAZolam (XANAX) 0.5 MG tablet, Take 1 tablet (0.5 mg total) by mouth daily as needed for anxiety., Disp: 90 tablet, Rfl: 0 .  citalopram (CELEXA) 10 MG tablet, Take 1 tablet (10 mg total) by mouth daily., Disp: 90 tablet, Rfl: 0 .  albuterol (PROVENTIL HFA;VENTOLIN HFA) 108 (90 BASE) MCG/ACT inhaler, Inhale into the lungs., Disp: , Rfl:  .  tiZANidine (ZANAFLEX) 4 MG tablet, take 1 tablet by mouth every 8 hours if needed, Disp: , Rfl: 0  No Known Allergies   Review of Systems  Respiratory: Positive for shortness of breath.   Cardiovascular: Negative for chest pain.  Neurological: Positive for dizziness.  Psychiatric/Behavioral: Negative for suicidal ideas. The  patient is nervous/anxious and has insomnia.      Objective  Vitals:   02/03/17 1547  BP: 124/72  Pulse: 77  Resp: 16  Temp: 98.3 F (36.8 C)  TempSrc: Oral  SpO2: 96%  Weight: 143 lb 1.6 oz (64.9 kg)  Height: 5\' 8"  (1.727 m)    Physical Exam  Constitutional: She is oriented to person, place, and time and well-developed, well-nourished, and in no distress.  HENT:  Head: Normocephalic and atraumatic.  Cardiovascular: Normal rate, regular rhythm, S1 normal, S2 normal and normal heart sounds.   No murmur heard. Pulmonary/Chest: Effort normal and breath sounds normal. She has no wheezes.  Neurological: She is alert and oriented to person, place, and time.  Psychiatric: Mood, memory, affect and judgment normal.  Nursing note and vitals reviewed.      Assessment & Plan  1. Generalized anxiety disorder Stable and responsive to alprazolam taken as needed and citalopram 10 mg taken every day, refills provided,  - ALPRAZolam (XANAX) 0.5 MG tablet; Take 1 tablet (0.5 mg total) by mouth daily as needed for anxiety.  Dispense: 90 tablet; Refill: 0 - citalopram (CELEXA) 10 MG tablet; Take 1 tablet (10 mg total) by mouth daily.  Dispense: 90 tablet; Refill: 0   Syed Asad A. [facility]  02/03/2017 3:52 PM

## 2017-03-15 ENCOUNTER — Ambulatory Visit (INDEPENDENT_AMBULATORY_CARE_PROVIDER_SITE_OTHER): Payer: BLUE CROSS/BLUE SHIELD | Admitting: Family Medicine

## 2017-03-15 ENCOUNTER — Encounter: Payer: Self-pay | Admitting: Family Medicine

## 2017-03-15 DIAGNOSIS — R42 Dizziness and giddiness: Secondary | ICD-10-CM | POA: Diagnosis not present

## 2017-03-15 DIAGNOSIS — J0100 Acute maxillary sinusitis, unspecified: Secondary | ICD-10-CM

## 2017-03-15 MED ORDER — AMOXICILLIN-POT CLAVULANATE 875-125 MG PO TABS: 1.0000 | ORAL_TABLET | Freq: Two times a day (BID) | ORAL | 0 refills | Status: DC

## 2017-03-15 MED ORDER — MECLIZINE HCL 25 MG PO TABS: 25.0000 mg | ORAL_TABLET | Freq: Two times a day (BID) | ORAL | 0 refills | Status: AC | PRN

## 2017-03-15 NOTE — Progress Notes (Signed)
Name: Rachel Caldwell   MRN: [id number]    DOB: 01-18-1971   Date:03/15/2017       Progress Note  Subjective  Chief Complaint  Chief Complaint  Patient presents with  . Dizziness    lightheaded since this morning     Dizziness  This is a new problem. The current episode started today. The problem has been gradually improving. Associated symptoms include headaches, neck pain (feels tension in her neck) and vertigo. Pertinent negatives include no chills, congestion, fever, visual change or vomiting. She has tried rest and lying down for the symptoms. The treatment provided moderate relief.     Past Medical History:  Diagnosis Date  . Anxiety     Past Surgical History:  Procedure Laterality Date  . ABDOMINAL HYSTERECTOMY  2005    Family History  Problem Relation Age of Onset  . Healthy Mother   . Healthy Father     Social History   Social History  . Marital status: Single    Spouse name: N/A  . Number of children: N/A  . Years of education: N/A   Occupational History  . Not on file.   Social History Main Topics  . Smoking status: Former Smoker    Quit date: 11/03/2012  . Smokeless tobacco: Never Used  . Alcohol use No  . Drug use: No  . Sexual activity: Yes    Partners: Male   Other Topics Concern  . Not on file   Social History Narrative  . No narrative on file     Current Outpatient Prescriptions:  .  albuterol (PROVENTIL HFA;VENTOLIN HFA) 108 (90 BASE) MCG/ACT inhaler, Inhale into the lungs., Disp: , Rfl:  .  ALPRAZolam (XANAX) 0.5 MG tablet, Take 1 tablet (0.5 mg total) by mouth daily as needed for anxiety., Disp: 90 tablet, Rfl: 0 .  citalopram (CELEXA) 10 MG tablet, Take 1 tablet (10 mg total) by mouth daily., Disp: 90 tablet, Rfl: 0 .  doxycycline (ORACEA) 40 MG capsule, Take 40 mg by mouth daily., Disp: , Rfl:  .  nitrofurantoin (MACRODANTIN) 100 MG capsule, Take 100 mg by mouth as needed., Disp: , Rfl:  .  tiZANidine (ZANAFLEX) 4 MG tablet, take  1 tablet by mouth every 8 hours if needed, Disp: , Rfl: 0  No Known Allergies   Review of Systems  Constitutional: Negative for chills and fever.  HENT: Negative for congestion.   Gastrointestinal: Negative for vomiting.  Musculoskeletal: Positive for neck pain (feels tension in her neck).  Neurological: Positive for dizziness, vertigo and headaches.     Objective  Vitals:   03/15/17 1343  BP: 123/74  Pulse: 76  Resp: 16  Temp: 98.8 F (37.1 C)  TempSrc: Oral  SpO2: 97%  Weight: 140 lb 14.4 oz (63.9 kg)  Height: 5\' 6"  (1.676 m)    Physical Exam  Constitutional: She is oriented to person, place, and time and well-developed, well-nourished, and in no distress.  HENT:  Head: Normocephalic and atraumatic.  Right Ear: Tympanic membrane and ear canal normal. No drainage or swelling.  Left Ear: Tympanic membrane and ear canal normal. No drainage or swelling.  Nose: Right sinus exhibits maxillary sinus tenderness. Left sinus exhibits maxillary sinus tenderness.  Mouth/Throat: No posterior oropharyngeal erythema.  Left nasal cavity mucosal inflammation, turbinate hypertrophy.  Cardiovascular: Normal rate, regular rhythm, S1 normal, S2 normal and normal heart sounds.   No murmur heard. Pulmonary/Chest: Effort normal and breath sounds normal. She has no wheezes. She  has no rales.  Neurological: She is alert and oriented to person, place, and time. She has normal strength and intact cranial nerves. Gait normal.  Psychiatric: Mood, memory, affect and judgment normal.  Nursing note and vitals reviewed.       Assessment & Plan  1. Acute non-recurrent maxillary sinusitis Suspect ongoing sinusitis, likely contributing to dizziness and headaches, started on amoxicillin-clavulanate for 10 days - amoxicillin-clavulanate (AUGMENTIN) 875-125 MG tablet; Take 1 tablet by mouth 2 (two) times daily.  Dispense: 20 tablet; Refill: 0  2. Vertigo Advised to take meclizine as needed for  vertigo, if symptoms persist, may need referral to ENT - meclizine (ANTIVERT) 25 MG tablet; Take 1 tablet (25 mg total) by mouth 2 (two) times daily as needed for dizziness.  Dispense: 10 tablet; Refill: 0   Mirabelle Cyphers Asad A. [facility] 03/15/2017 1:53 PM

## 2017-05-06 ENCOUNTER — Ambulatory Visit (INDEPENDENT_AMBULATORY_CARE_PROVIDER_SITE_OTHER): Payer: BLUE CROSS/BLUE SHIELD | Admitting: Family Medicine

## 2017-05-06 ENCOUNTER — Encounter: Payer: Self-pay | Admitting: Family Medicine

## 2017-05-06 VITALS — BP 121/69 | HR 76 | Temp 98.5°F | Resp 16 | Ht 66.0 in | Wt 138.1 lb

## 2017-05-06 DIAGNOSIS — F411 Generalized anxiety disorder: Secondary | ICD-10-CM | POA: Diagnosis not present

## 2017-05-06 DIAGNOSIS — Z0000 Encounter for general adult medical examination without abnormal findings: Secondary | ICD-10-CM | POA: Diagnosis not present

## 2017-05-06 LAB — CBC WITH DIFFERENTIAL/PLATELET
BASOS PCT: 0 %
Basophils Absolute: 0 cells/uL (ref 0–200)
Eosinophils Absolute: 0 cells/uL — ABNORMAL LOW (ref 15–500)
Eosinophils Relative: 0 %
HCT: 39.8 % (ref 35.0–45.0)
Hemoglobin: 12.9 g/dL (ref 11.7–15.5)
LYMPHS PCT: 29 %
Lymphs Abs: 2813 cells/uL (ref 850–3900)
MCH: 31.2 pg (ref 27.0–33.0)
MCHC: 32.4 g/dL (ref 32.0–36.0)
MCV: 96.1 fL (ref 80.0–100.0)
MONOS PCT: 5 %
MPV: 10.8 fL (ref 7.5–12.5)
Monocytes Absolute: 485 cells/uL (ref 200–950)
Neutro Abs: 6402 cells/uL (ref 1500–7800)
Neutrophils Relative %: 66 %
PLATELETS: 249 10*3/uL (ref 140–400)
RBC: 4.14 MIL/uL (ref 3.80–5.10)
RDW: 13.5 % (ref 11.0–15.0)
WBC: 9.7 10*3/uL (ref 3.8–10.8)

## 2017-05-06 LAB — TSH: TSH: 2.06 mIU/L

## 2017-05-06 MED ORDER — CITALOPRAM HYDROBROMIDE 10 MG PO TABS: 10.0000 mg | ORAL_TABLET | Freq: Every day | ORAL | 0 refills | Status: DC

## 2017-05-06 MED ORDER — ALPRAZOLAM 0.5 MG PO TABS: 0.5000 mg | ORAL_TABLET | Freq: Every day | ORAL | 0 refills | Status: DC | PRN

## 2017-05-06 NOTE — Progress Notes (Signed)
Name: Rachel Caldwell   MRN: [id number]    DOB: 1971/04/13   Date:05/06/2017       Progress Note  Subjective  Chief Complaint  Chief Complaint  Patient presents with  . Annual Exam    CPE    Anxiety  Presents for follow-up visit. Symptoms include nervous/anxious behavior. Patient reports no chest pain, depressed mood, dizziness, excessive worry, insomnia, nausea, panic or shortness of breath. The severity of symptoms is moderate. The quality of sleep is good.      Pt. Is here for Complete Physical Exam.  She is not due for breast cancer or colon cancer screening.  She had a hysterectomy in 2005, Pap smear no longer indicated.    Past Medical History:  Diagnosis Date  . Anxiety     Past Surgical History:  Procedure Laterality Date  . ABDOMINAL HYSTERECTOMY  2005    Family History  Problem Relation Age of Onset  . Healthy Mother   . Healthy Father     Social History   Social History  . Marital status: Single    Spouse name: N/A  . Number of children: N/A  . Years of education: N/A   Occupational History  . Not on file.   Social History Main Topics  . Smoking status: Former Smoker    Quit date: 11/03/2012  . Smokeless tobacco: Never Used  . Alcohol use No  . Drug use: No  . Sexual activity: Yes    Partners: Male   Other Topics Concern  . Not on file   Social History Narrative  . No narrative on file     Current Outpatient Prescriptions:  .  albuterol (PROVENTIL HFA;VENTOLIN HFA) 108 (90 BASE) MCG/ACT inhaler, Inhale into the lungs., Disp: , Rfl:  .  ALPRAZolam (XANAX) 0.5 MG tablet, Take 1 tablet (0.5 mg total) by mouth daily as needed for anxiety., Disp: 90 tablet, Rfl: 0 .  citalopram (CELEXA) 10 MG tablet, Take 1 tablet (10 mg total) by mouth daily., Disp: 90 tablet, Rfl: 0 .  doxycycline (ORACEA) 40 MG capsule, Take 40 mg by mouth daily., Disp: , Rfl:  .  nitrofurantoin (MACRODANTIN) 100 MG capsule, Take 100 mg by mouth as needed., Disp: , Rfl:   .  tiZANidine (ZANAFLEX) 4 MG tablet, take 1 tablet by mouth every 8 hours if needed, Disp: , Rfl: 0 .  amoxicillin-clavulanate (AUGMENTIN) 875-125 MG tablet, Take 1 tablet by mouth 2 (two) times daily. (Patient not taking: Reported on 05/06/2017), Disp: 20 tablet, Rfl: 0  No Known Allergies   Review of Systems  Constitutional: Negative for chills, fever and malaise/fatigue.  HENT: Negative for congestion, ear pain and sore throat.   Eyes: Negative for blurred vision and double vision.  Respiratory: Negative for cough, sputum production and shortness of breath.   Cardiovascular: Negative for chest pain and leg swelling.  Gastrointestinal: Negative for blood in stool, constipation, diarrhea, nausea and vomiting.  Genitourinary: Negative for hematuria.  Musculoskeletal: Negative for back pain and neck pain.  Neurological: Negative for dizziness and headaches.  Psychiatric/Behavioral: Negative for depression. The patient is nervous/anxious. The patient does not have insomnia.       Objective  Vitals:   05/06/17 1456  BP: 121/69  Pulse: 76  Resp: 16  Temp: 98.5 F (36.9 C)  TempSrc: Oral  SpO2: 98%  Weight: 138 lb 1.6 oz (62.6 kg)  Height: 5\' 6"  (1.676 m)    Physical Exam  Constitutional: She is oriented to  person, place, and time and well-developed, well-nourished, and in no distress.  HENT:  Head: Normocephalic and atraumatic.  Right Ear: External ear normal.  Left Ear: External ear normal.  Mouth/Throat: Oropharynx is clear and moist.  Eyes: Pupils are equal, round, and reactive to light.  Neck: Normal range of motion. Neck supple.  Cardiovascular: Normal rate, regular rhythm and normal heart sounds.   No murmur heard. Pulmonary/Chest: Effort normal and breath sounds normal. She has no wheezes.  Abdominal: Soft. Bowel sounds are normal.  Musculoskeletal: She exhibits no edema.  Neurological: She is alert and oriented to person, place, and time.  Skin: Skin is warm  and dry.  Psychiatric: Mood, memory, affect and judgment normal.  Nursing note and vitals reviewed.    Assessment & Plan  1. Well woman exam (no gynecological exam) Obtain age-appropriate laboratory screening - CBC with Differential/Platelet - COMPLETE METABOLIC PANEL WITH GFR - Lipid panel - TSH - VITAMIN D 25 Hydroxy (Vit-D Deficiency, Fractures)  2. Generalized anxiety disorder Symptoms stable on citalopram and alprazolam taken as needed, refills provided - citalopram (CELEXA) 10 MG tablet; Take 1 tablet (10 mg total) by mouth daily.  Dispense: 90 tablet; Refill: 0 - ALPRAZolam (XANAX) 0.5 MG tablet; Take 1 tablet (0.5 mg total) by mouth daily as needed for anxiety.  Dispense: 90 tablet; Refill: 0   Rachel Caldwell A. [facility] 05/06/2017 3:04 PM

## 2017-05-07 LAB — LIPID PANEL
Cholesterol: 141 mg/dL (ref <200)
HDL: 58 mg/dL (ref >50)
LDL Cholesterol: 69 mg/dL (ref <100)
TRIGLYCERIDES: 68 mg/dL (ref <150)
Total CHOL/HDL Ratio: 2.4 Ratio (ref <5.0)
VLDL: 14 mg/dL (ref <30)

## 2017-05-07 LAB — COMPLETE METABOLIC PANEL WITH GFR
ALT: 9 U/L (ref 6–29)
AST: 13 U/L (ref 10–35)
Albumin: 4.1 g/dL (ref 3.6–5.1)
Alkaline Phosphatase: 65 U/L (ref 33–115)
BUN: 9 mg/dL (ref 7–25)
CHLORIDE: 102 mmol/L (ref 98–110)
CO2: 24 mmol/L (ref 20–31)
Calcium: 9.3 mg/dL (ref 8.6–10.2)
Creat: 0.77 mg/dL (ref 0.50–1.10)
GFR, Est Non African American: >89 mL/min (ref >=60)
Glucose, Bld: 89 mg/dL (ref 65–99)
Potassium: 4.5 mmol/L (ref 3.5–5.3)
Sodium: 137 mmol/L (ref 135–146)
Total Bilirubin: 0.5 mg/dL (ref 0.2–1.2)
Total Protein: 6.3 g/dL (ref 6.1–8.1)

## 2017-05-07 LAB — VITAMIN D 25 HYDROXY (VIT D DEFICIENCY, FRACTURES): Vit D, 25-Hydroxy: 46 ng/mL (ref 30–100)

## 2017-05-11 DIAGNOSIS — J019 Acute sinusitis, unspecified: Secondary | ICD-10-CM | POA: Diagnosis not present

## 2017-08-05 ENCOUNTER — Encounter: Payer: Self-pay | Admitting: Family Medicine

## 2017-08-05 ENCOUNTER — Ambulatory Visit (INDEPENDENT_AMBULATORY_CARE_PROVIDER_SITE_OTHER): Payer: BLUE CROSS/BLUE SHIELD | Admitting: Family Medicine

## 2017-08-05 DIAGNOSIS — F411 Generalized anxiety disorder: Secondary | ICD-10-CM | POA: Diagnosis not present

## 2017-08-05 MED ORDER — CITALOPRAM HYDROBROMIDE 10 MG PO TABS: 10.0000 mg | ORAL_TABLET | Freq: Every day | ORAL | 0 refills | Status: DC

## 2017-08-05 MED ORDER — ALPRAZOLAM 0.5 MG PO TABS: 0.5000 mg | ORAL_TABLET | Freq: Every day | ORAL | 0 refills | Status: DC | PRN

## 2017-08-05 NOTE — Progress Notes (Signed)
Name: Rachel Caldwell   MRN: [id number]    DOB: 1971/03/15   Date:08/05/2017       Progress Note  Subjective  Chief Complaint  Chief Complaint  Patient presents with  . Follow-up    3 mo  . Medication Refill  . Anxiety    Anxiety  Presents for follow-up visit. Symptoms include chest pain, excessive worry, irritability, nervous/anxious behavior, palpitations, panic and shortness of breath. Patient reports no depressed mood or insomnia. The severity of symptoms is moderate, causing significant distress and interfering with daily activities.      Past Medical History:  Diagnosis Date  . Anxiety     Past Surgical History:  Procedure Laterality Date  . ABDOMINAL HYSTERECTOMY  2005    Family History  Problem Relation Age of Onset  . Healthy Mother   . Healthy Father     Social History   Social History  . Marital status: Single    Spouse name: N/A  . Number of children: N/A  . Years of education: N/A   Occupational History  . Not on file.   Social History Main Topics  . Smoking status: Former Smoker    Quit date: 11/03/2012  . Smokeless tobacco: Never Used  . Alcohol use No  . Drug use: No  . Sexual activity: Yes    Partners: Male   Other Topics Concern  . Not on file   Social History Narrative  . No narrative on file     Current Outpatient Prescriptions:  .  albuterol (PROVENTIL HFA;VENTOLIN HFA) 108 (90 BASE) MCG/ACT inhaler, Inhale into the lungs., Disp: , Rfl:  .  ALPRAZolam (XANAX) 0.5 MG tablet, Take 1 tablet (0.5 mg total) by mouth daily as needed for anxiety., Disp: 90 tablet, Rfl: 0 .  citalopram (CELEXA) 10 MG tablet, Take 1 tablet (10 mg total) by mouth daily., Disp: 90 tablet, Rfl: 0 .  doxycycline (ORACEA) 40 MG capsule, Take 40 mg by mouth daily., Disp: , Rfl:  .  nitrofurantoin (MACRODANTIN) 100 MG capsule, Take 100 mg by mouth as needed., Disp: , Rfl:  .  tiZANidine (ZANAFLEX) 4 MG tablet, take 1 tablet by mouth every 8 hours if needed,  Disp: , Rfl: 0  No Known Allergies   Review of Systems  Constitutional: Positive for irritability.  Respiratory: Positive for shortness of breath.   Cardiovascular: Positive for chest pain and palpitations.  Psychiatric/Behavioral: The patient is nervous/anxious. The patient does not have insomnia.       Objective  Vitals:   08/05/17 1554  BP: 122/68  Pulse: 68  Resp: 15  Temp: 98 F (36.7 C)  TempSrc: Oral  SpO2: 98%  Weight: 137 lb (62.1 kg)  Height: 5\' 6"  (1.676 m)    Physical Exam  Constitutional: She is oriented to person, place, and time and well-developed, well-nourished, and in no distress.  HENT:  Head: Normocephalic and atraumatic.  Cardiovascular: Normal rate, regular rhythm and normal heart sounds.   No murmur heard. Pulmonary/Chest: Effort normal and breath sounds normal. She has no wheezes.  Neurological: She is alert and oriented to person, place, and time.  Psychiatric: Mood, memory, affect and judgment normal.  Nursing note and vitals reviewed.     Assessment & Plan  1. Generalized anxiety disorder Symptoms of anxiety are stable and responsive to alprazolam taken daily as needed, Celexa 10 mg taken daily, refills provided - ALPRAZolam (XANAX) 0.5 MG tablet; Take 1 tablet (0.5 mg total) by mouth daily  as needed for anxiety.  Dispense: 90 tablet; Refill: 0 - citalopram (CELEXA) 10 MG tablet; Take 1 tablet (10 mg total) by mouth daily.  Dispense: 90 tablet; Refill: 0    Ottie Tillery Asad A. [facility] 08/05/2017 4:00 PM

## 2017-08-26 DIAGNOSIS — N3020 Other chronic cystitis without hematuria: Secondary | ICD-10-CM | POA: Diagnosis not present

## 2017-08-26 DIAGNOSIS — R339 Retention of urine, unspecified: Secondary | ICD-10-CM | POA: Diagnosis not present

## 2017-08-26 DIAGNOSIS — R3129 Other microscopic hematuria: Secondary | ICD-10-CM | POA: Diagnosis not present

## 2017-11-03 ENCOUNTER — Ambulatory Visit: Payer: BLUE CROSS/BLUE SHIELD | Admitting: Family Medicine

## 2017-11-03 ENCOUNTER — Encounter: Payer: Self-pay | Admitting: Family Medicine

## 2017-11-03 DIAGNOSIS — F411 Generalized anxiety disorder: Secondary | ICD-10-CM | POA: Diagnosis not present

## 2017-11-03 MED ORDER — ALPRAZOLAM 0.5 MG PO TABS: 0.5000 mg | ORAL_TABLET | Freq: Every day | ORAL | 0 refills | Status: DC | PRN

## 2017-11-03 MED ORDER — CITALOPRAM HYDROBROMIDE 10 MG PO TABS: 10.0000 mg | ORAL_TABLET | Freq: Every day | ORAL | 0 refills | Status: DC

## 2017-11-03 NOTE — Progress Notes (Signed)
Name: Rachel Caldwell   MRN: [id number]    DOB: 01/22/1971   Date:11/03/2017       Progress Note  Subjective  Chief Complaint  Chief Complaint  Patient presents with  . Medication Refill  . Anxiety    3 month f/u  . Depression    3 month f/u    Anxiety  Presents for follow-up visit. Patient reports no chest pain, depressed mood, excessive worry, irritability, nervous/anxious behavior, palpitations, panic or shortness of breath. The severity of symptoms is moderate, causing significant distress and interfering with daily activities. The quality of sleep is good.      Past Medical History:  Diagnosis Date  . Anxiety     Past Surgical History:  Procedure Laterality Date  . ABDOMINAL HYSTERECTOMY  2005    Family History  Problem Relation Age of Onset  . Healthy Mother   . Healthy Father     Social History   Socioeconomic History  . Marital status: Single    Spouse name: Not on file  . Number of children: Not on file  . Years of education: Not on file  . Highest education level: Not on file  Social Needs  . Financial resource strain: Not on file  . Food insecurity - worry: Not on file  . Food insecurity - inability: Not on file  . Transportation needs - medical: Not on file  . Transportation needs - non-medical: Not on file  Occupational History  . Not on file  Tobacco Use  . Smoking status: Former Smoker    Last attempt to quit: 11/03/2012    Years since quitting: 5.0  . Smokeless tobacco: Never Used  Substance and Sexual Activity  . Alcohol use: No    Alcohol/week: 0.0 oz  . Drug use: No  . Sexual activity: Yes    Partners: Male  Other Topics Concern  . Not on file  Social History Narrative  . Not on file     Current Outpatient Medications:  .  albuterol (PROVENTIL HFA;VENTOLIN HFA) 108 (90 BASE) MCG/ACT inhaler, Inhale into the lungs., Disp: , Rfl:  .  ALPRAZolam (XANAX) 0.5 MG tablet, Take 1 tablet (0.5 mg total) by mouth daily as needed for  anxiety., Disp: 90 tablet, Rfl: 0 .  citalopram (CELEXA) 10 MG tablet, Take 1 tablet (10 mg total) by mouth daily., Disp: 90 tablet, Rfl: 0  No Known Allergies   Review of Systems  Constitutional: Negative for irritability.  Respiratory: Negative for shortness of breath.   Cardiovascular: Negative for chest pain and palpitations.  Psychiatric/Behavioral: The patient is not nervous/anxious.      Objective  Vitals:   11/03/17 1535  BP: 110/64  Pulse: 73  Resp: 16  Temp: 98.9 F (37.2 C)  TempSrc: Oral  SpO2: 99%  Weight: 137 lb 6.4 oz (62.3 kg)    Physical Exam  Constitutional: She is oriented to person, place, and time and well-developed, well-nourished, and in no distress.  HENT:  Head: Normocephalic and atraumatic.  Cardiovascular: Normal rate, regular rhythm, S1 normal, S2 normal and normal heart sounds.  No murmur heard. Pulmonary/Chest: Effort normal and breath sounds normal. She has no wheezes.  Neurological: She is alert and oriented to person, place, and time.  Psychiatric: Mood, memory, affect and judgment normal.  Nursing note and vitals reviewed.       Assessment & Plan  1. Generalized anxiety disorder Stable, advised of my impending departure from practice and the need to see  a psychiatrist for management of anxiety.Referral provided.  - ALPRAZolam (XANAX) 0.5 MG tablet; Take 1 tablet (0.5 mg total) by mouth daily as needed for anxiety.  Dispense: 90 tablet; Refill: 0 - citalopram (CELEXA) 10 MG tablet; Take 1 tablet (10 mg total) by mouth daily.  Dispense: 90 tablet; Refill: 0 - Ambulatory referral to Psychiatry   Abagael Kramm Asad A. [facility] 11/03/2017 3:41 PM

## 2017-11-04 ENCOUNTER — Ambulatory Visit: Payer: BLUE CROSS/BLUE SHIELD | Admitting: Family Medicine

## 2018-02-07 ENCOUNTER — Encounter: Payer: Self-pay | Admitting: Family Medicine

## 2018-02-07 ENCOUNTER — Ambulatory Visit: Payer: BLUE CROSS/BLUE SHIELD | Admitting: Family Medicine

## 2018-02-07 VITALS — BP 104/70 | HR 101 | Ht 66.0 in | Wt 135.9 lb

## 2018-02-07 DIAGNOSIS — Z23 Encounter for immunization: Secondary | ICD-10-CM | POA: Diagnosis not present

## 2018-02-07 DIAGNOSIS — Z1239 Encounter for other screening for malignant neoplasm of breast: Secondary | ICD-10-CM

## 2018-02-07 DIAGNOSIS — F411 Generalized anxiety disorder: Secondary | ICD-10-CM | POA: Diagnosis not present

## 2018-02-07 DIAGNOSIS — N3090 Cystitis, unspecified without hematuria: Secondary | ICD-10-CM | POA: Diagnosis not present

## 2018-02-07 DIAGNOSIS — Z1231 Encounter for screening mammogram for malignant neoplasm of breast: Secondary | ICD-10-CM | POA: Diagnosis not present

## 2018-02-07 MED ORDER — CITALOPRAM HYDROBROMIDE 20 MG PO TABS: 20.0000 mg | ORAL_TABLET | Freq: Every day | ORAL | 1 refills | Status: DC

## 2018-02-07 MED ORDER — ALPRAZOLAM 0.5 MG PO TABS: 0.5000 mg | ORAL_TABLET | Freq: Every day | ORAL | 0 refills | Status: DC | PRN

## 2018-02-07 MED ORDER — BUSPIRONE HCL 5 MG PO TABS: 5.0000 mg | ORAL_TABLET | Freq: Three times a day (TID) | ORAL | 0 refills | Status: DC

## 2018-02-07 NOTE — Progress Notes (Signed)
Name: Rachel Caldwell   MRN: [id number]    DOB: 02-29-1971   Date:02/07/2018       Progress Note  Subjective  Chief Complaint  Chief Complaint  Patient presents with  . Anxiety  . Asthma    HPI  GAD: she states she has always be a worrier, she has been taking Citalopram 10 mg in am's and also alprazolam 0.5 mg before going to work. Discussed FDA and she agrees on taking alprazolam only for panic attacks and trying Buspar prn, we will also increase Citalopram to 20 mg daily. She states panic attacks are seldom with Citalopram 10 mg - her current dose, but anxiety is not completely controlled. GAD questionnaire filled out and reviewed with patient today.   Mild Intermittent asthma: she states diagnosed as a child, she uses albuterol prn and still has a rx in her purse. She denies wheezing, cough or SOB at this time  Recurrent cystitis: she sees Dr. Jacqlyn Larsen, she has antibiotics at home that she takes prn when she has symptoms, seeing Dr. Jacqlyn Larsen once a year. Previous history of surgery as child, [facility]    Patient Active Problem List   Diagnosis Date Noted  . Mild intermittent asthma 05/11/2014  . Generalized anxiety disorder 12/01/2007  . Recurrent cystitis 12/01/2007    Past Surgical History:  Procedure Laterality Date  . ABDOMINAL HYSTERECTOMY  2005    Family History  Problem Relation Age of Onset  . Healthy Mother   . Healthy Father     Social History   Socioeconomic History  . Marital status: Married    Spouse name: Broadus John   . Number of children: 0  . Years of education: Not on file  . Highest education level: 12th grade  Social Needs  . Financial resource strain: Not on file  . Food insecurity - worry: Not on file  . Food insecurity - inability: Not on file  . Transportation needs - medical: Not on file  . Transportation needs - non-medical: Not on file  Occupational History  . Occupation: fund transfer   Tobacco Use  . Smoking status: Former Smoker    Packs/day: 0.50     Years: 12.00    Pack years: 6.00    Types: Cigarettes    Last attempt to quit: 11/03/2012    Years since quitting: 5.2  . Smokeless tobacco: Never Used  Substance and Sexual Activity  . Alcohol use: No    Alcohol/week: 0.0 oz  . Drug use: No  . Sexual activity: Yes    Partners: Male  Other Topics Concern  . Not on file  Social History Narrative   Married, never had children secondary to gyn problems and two miscarriages, has one dog      Current Outpatient Medications:  .  albuterol (PROVENTIL HFA;VENTOLIN HFA) 108 (90 BASE) MCG/ACT inhaler, Inhale into the lungs., Disp: , Rfl:  .  ALPRAZolam (XANAX) 0.5 MG tablet, Take 1 tablet (0.5 mg total) by mouth daily as needed for anxiety., Disp: 10 tablet, Rfl: 0 .  busPIRone (BUSPAR) 5 MG tablet, Take 1 tablet (5 mg total) by mouth 3 (three) times daily., Disp: 90 tablet, Rfl: 0 .  citalopram (CELEXA) 20 MG tablet, Take 1 tablet (20 mg total) by mouth daily., Disp: 90 tablet, Rfl: 1 .  nitrofurantoin, macrocrystal-monohydrate, (MACROBID) 100 MG capsule, Take 100 mg by mouth 2 (two) times daily., Disp: , Rfl:   No Known Allergies   ROS  Constitutional: Negative for fever  or weight change.  Respiratory: Negative for cough and shortness of breath.   Cardiovascular: Negative for chest pain or palpitations.  Gastrointestinal: Negative for abdominal pain, no bowel changes.  Musculoskeletal: Negative for gait problem or joint swelling.  Skin: Negative for rash.  Neurological: Negative for dizziness or headache.  No other specific complaints in a complete review of systems (except as listed in HPI above).   Objective  Vitals:   02/07/18 1507  BP: 104/70  Pulse: (!) 101  Weight: 135 lb 14.4 oz (61.6 kg)  Height: 5\' 6"  (1.676 m)    Body mass index is 21.93 kg/m.  Physical Exam  Constitutional: Patient appears well-developed and well-nourished.  No distress.  HEENT: head atraumatic, normocephalic, pupils equal and reactive  to light, neck supple, throat within normal limits Cardiovascular: Normal rate, regular rhythm and normal heart sounds.  No murmur heard. No BLE edema. Pulmonary/Chest: Effort normal and breath sounds normal. No respiratory distress. Abdominal: Soft.  There is no tenderness. Psychiatric: Patient has a normal mood and affect. behavior is normal. Judgment and thought content normal.  PHQ2/9: Depression screen [facility] 2/9 02/07/2018 11/03/2017 08/05/2017 05/06/2017 03/15/2017  Decreased Interest 0 0 0 0 0  Down, Depressed, Hopeless 0 0 0 0 0  PHQ - 2 Score 0 0 0 0 0  Altered sleeping 1 - - - -  Tired, decreased energy 0 - - - -  Change in appetite 0 - - - -  Feeling bad or failure about yourself  0 - - - -  Trouble concentrating 1 - - - -  Moving slowly or fidgety/restless 0 - - - -  Suicidal thoughts 0 - - - -  PHQ-9 Score 2 - - - -     Fall Risk: Fall Risk  02/07/2018 11/03/2017 08/05/2017 05/06/2017 03/15/2017  Falls in the past year? No No No No No     Functional Status Survey: Is the patient deaf or have difficulty hearing?: No Does the patient have difficulty seeing, even when wearing glasses/contacts?: No Does the patient have difficulty concentrating, remembering, or making decisions?: No Does the patient have difficulty walking or climbing stairs?: No Does the patient have difficulty dressing or bathing?: No Does the patient have difficulty doing errands alone such as visiting a doctor's office or shopping?: No  GAD 7 : Generalized Anxiety Score 02/07/2018  Nervous, Anxious, on Edge 2  Control/stop worrying 3  Worry too much - different things 3  Trouble relaxing 2  Restless 2  Easily annoyed or irritable 1     Assessment & Plan  1. Generalized anxiety disorder  - citalopram (CELEXA) 20 MG tablet; Take 1 tablet (20 mg total) by mouth daily.  Dispense: 90 tablet; Refill: 1 - ALPRAZolam (XANAX) 0.5 MG tablet; Take 1 tablet (0.5 mg total) by mouth daily as needed for anxiety.   Dispense: 10 tablet; Refill: 0  2. Recurrent cystitis  Continue follow up with Dr. Jacqlyn Larsen  3. Need for Tdap vaccination  - Tdap vaccine greater than or equal to 7yo IM  4. Breast cancer screening  - MM SCREENING BREAST TOMO BILATERAL; Future

## 2018-02-22 ENCOUNTER — Ambulatory Visit
Admission: RE | Admit: 2018-02-22 | Discharge: 2018-02-22 | Disposition: A | Discharge status: Home / Self Care | Payer: BLUE CROSS/BLUE SHIELD | Source: Ambulatory Visit | Attending: Family Medicine | Admitting: Family Medicine

## 2018-02-22 DIAGNOSIS — Z1231 Encounter for screening mammogram for malignant neoplasm of breast: Secondary | ICD-10-CM | POA: Insufficient documentation

## 2018-02-22 DIAGNOSIS — Z1239 Encounter for other screening for malignant neoplasm of breast: Secondary | ICD-10-CM

## 2018-02-28 ENCOUNTER — Other Ambulatory Visit: Payer: Self-pay

## 2018-02-28 ENCOUNTER — Emergency Department: Payer: BLUE CROSS/BLUE SHIELD

## 2018-02-28 ENCOUNTER — Emergency Department
Admission: EM | Admit: 2018-02-28 | Discharge: 2018-02-28 | Disposition: A | Discharge status: Home / Self Care | Payer: BLUE CROSS/BLUE SHIELD | Attending: Emergency Medicine | Admitting: Emergency Medicine

## 2018-02-28 ENCOUNTER — Encounter: Payer: Self-pay | Admitting: Emergency Medicine

## 2018-02-28 DIAGNOSIS — N83209 Unspecified ovarian cyst, unspecified side: Secondary | ICD-10-CM | POA: Insufficient documentation

## 2018-02-28 DIAGNOSIS — Z79899 Other long term (current) drug therapy: Secondary | ICD-10-CM | POA: Diagnosis not present

## 2018-02-28 DIAGNOSIS — J4520 Mild intermittent asthma, uncomplicated: Secondary | ICD-10-CM | POA: Diagnosis not present

## 2018-02-28 DIAGNOSIS — Z87891 Personal history of nicotine dependence: Secondary | ICD-10-CM | POA: Insufficient documentation

## 2018-02-28 DIAGNOSIS — K5900 Constipation, unspecified: Secondary | ICD-10-CM | POA: Diagnosis not present

## 2018-02-28 DIAGNOSIS — R1084 Generalized abdominal pain: Secondary | ICD-10-CM | POA: Insufficient documentation

## 2018-02-28 DIAGNOSIS — R109 Unspecified abdominal pain: Secondary | ICD-10-CM | POA: Diagnosis not present

## 2018-02-28 LAB — CBC WITH DIFFERENTIAL/PLATELET
BASOS ABS: 0 10*3/uL (ref 0–0.1)
Basophils Relative: 0 %
EOS PCT: 1 %
Eosinophils Absolute: 0.1 10*3/uL (ref 0–0.7)
HCT: 39.7 % (ref 35.0–47.0)
HEMOGLOBIN: 13.8 g/dL (ref 12.0–16.0)
LYMPHS ABS: 2.4 10*3/uL (ref 1.0–3.6)
LYMPHS PCT: 27 %
MCH: 31.9 pg (ref 26.0–34.0)
MCHC: 34.7 g/dL (ref 32.0–36.0)
MCV: 92 fL (ref 80.0–100.0)
Monocytes Absolute: 0.6 10*3/uL (ref 0.2–0.9)
Monocytes Relative: 6 %
NEUTROS ABS: 5.8 10*3/uL (ref 1.4–6.5)
NEUTROS PCT: 66 %
PLATELETS: 225 10*3/uL (ref 150–440)
RBC: 4.32 MIL/uL (ref 3.80–5.20)
RDW: 13.1 % (ref 11.5–14.5)
WBC: 8.9 10*3/uL (ref 3.6–11.0)

## 2018-02-28 LAB — COMPREHENSIVE METABOLIC PANEL
ALT: 10 U/L — AB (ref 14–54)
AST: 15 U/L (ref 15–41)
Albumin: 4 g/dL (ref 3.5–5.0)
Alkaline Phosphatase: 60 U/L (ref 38–126)
Anion gap: 5 (ref 5–15)
BUN: 16 mg/dL (ref 6–20)
CHLORIDE: 108 mmol/L (ref 101–111)
CO2: 24 mmol/L (ref 22–32)
CREATININE: 0.76 mg/dL (ref 0.44–1.00)
Calcium: 9 mg/dL (ref 8.9–10.3)
GFR calc non Af Amer: >60 mL/min (ref >60)
GLUCOSE: 108 mg/dL — AB (ref 65–99)
Potassium: 4 mmol/L (ref 3.5–5.1)
SODIUM: 137 mmol/L (ref 135–145)
Total Bilirubin: 0.5 mg/dL (ref 0.3–1.2)
Total Protein: 6.8 g/dL (ref 6.5–8.1)

## 2018-02-28 LAB — URINALYSIS, COMPLETE (UACMP) WITH MICROSCOPIC
Bilirubin Urine: NEGATIVE
Glucose, UA: NEGATIVE mg/dL
Ketones, ur: NEGATIVE mg/dL
Leukocytes, UA: NEGATIVE
Nitrite: NEGATIVE
PROTEIN: NEGATIVE mg/dL
SPECIFIC GRAVITY, URINE: 1.023 (ref 1.005–1.030)
pH: 5 (ref 5.0–8.0)

## 2018-02-28 LAB — PREGNANCY, URINE: Preg Test, Ur: NEGATIVE

## 2018-02-28 LAB — LIPASE, BLOOD: LIPASE: 40 U/L (ref 11–51)

## 2018-02-28 MED ORDER — POLYETHYLENE GLYCOL 3350 17 GM/SCOOP PO POWD: 17.0000 g | Freq: Every day | ORAL | 0 refills | Status: DC | PRN

## 2018-02-28 MED ORDER — IOPAMIDOL (ISOVUE-300) INJECTION 61%
100.0000 mL | Freq: Once | INTRAVENOUS | Status: AC | PRN
  Administered 2018-02-28: 100 mL via INTRAVENOUS

## 2018-02-28 MED ORDER — MORPHINE SULFATE (PF) 4 MG/ML IV SOLN
INTRAVENOUS | Status: AC
  Filled 2018-02-28: qty 1

## 2018-02-28 MED ORDER — ONDANSETRON HCL 4 MG/2ML IJ SOLN
4.0000 mg | Freq: Once | INTRAMUSCULAR | Status: AC
  Administered 2018-02-28: 4 mg via INTRAVENOUS
  Filled 2018-02-28: qty 2

## 2018-02-28 MED ORDER — IOPAMIDOL (ISOVUE-300) INJECTION 61%
15.0000 mL | INTRAVENOUS | Status: AC
  Administered 2018-02-28: 15 mL via ORAL

## 2018-02-28 MED ORDER — MORPHINE SULFATE (PF) 4 MG/ML IV SOLN
4.0000 mg | Freq: Once | INTRAVENOUS | Status: AC
  Administered 2018-02-28: 4 mg via INTRAVENOUS
  Filled 2018-02-28: qty 1

## 2018-02-28 MED ORDER — DOCUSATE SODIUM 100 MG PO CAPS: 100.0000 mg | ORAL_CAPSULE | Freq: Two times a day (BID) | ORAL | 0 refills | Status: AC

## 2018-02-28 MED ORDER — MORPHINE SULFATE (PF) 4 MG/ML IV SOLN
4.0000 mg | Freq: Once | INTRAVENOUS | Status: AC
  Administered 2018-02-28: 4 mg via INTRAVENOUS

## 2018-02-28 MED ORDER — HYDROCODONE-ACETAMINOPHEN 5-325 MG PO TABS: 1.0000 | ORAL_TABLET | ORAL | 0 refills | Status: DC | PRN

## 2018-02-28 MED ORDER — SODIUM CHLORIDE 0.9 % IV BOLUS
1000.0000 mL | Freq: Once | INTRAVENOUS | Status: AC
Start: 2018-02-28 — End: 2018-02-28
  Administered 2018-02-28: 1000 mL via INTRAVENOUS

## 2018-02-28 NOTE — ED Triage Notes (Addendum)
Patient ambulatory to triage with complaints of right lower abdominal/inguinal pain that started at 0300, sharp pain 10/10 now however pain started last week but was mild --, pt hx of bladder surgery when a infant and chronic UTIs --  denies s/sx of UTI.   Partial hysterectomy and takes  nitrofurantoin PRN for UTIs.  Pt denies V/D/HA/chills/sweating/fever reports feeling nauseous from the pain, pt states pain is relieved somewhat by standing. Pt denies taking medications before coming to ED.  Speaking in complete coherent sentences. No acute breathing distress noted.  Pt reporting tenderness to palpation periumbilicus

## 2018-02-28 NOTE — ED Provider Notes (Addendum)
[facility] Emergency Department Provider Note  Time seen: 7:53 AM  I have reviewed the triage vital signs and the nursing notes.   HISTORY  Chief Complaint Abdominal Pain    HPI Rachel Caldwell is a 47 y.o. female with no significant past medical history who presents to the emergency department for generalized abdominal pain.  According to the patient 1 week ago she developed pain that lasted several hours, was severe but then dissipated on its own.  She states for the past 1 week she has been pain-free until 3:00 this morning she awoke with what appear to be more lower abdominal pain but has progressed to be generalized abdominal pain.  Denies any focal area of increased pain but does state moderate pain all over.  States nausea but denies vomiting.  Denies diarrhea dysuria, hematuria, vaginal bleeding or discharge.  Describes the pain as moderate to severe dull aching diffusely.   Past Medical History:  Diagnosis Date  . Anxiety     Patient Active Problem List   Diagnosis Date Noted  . Mild intermittent asthma 05/11/2014  . Generalized anxiety disorder 12/01/2007  . Recurrent cystitis 12/01/2007    Past Surgical History:  Procedure Laterality Date  . ABDOMINAL HYSTERECTOMY  2005    Prior to Admission medications   Medication Sig Start Date End Date Taking? Authorizing Provider  albuterol (PROVENTIL HFA;VENTOLIN HFA) 108 (90 BASE) MCG/ACT inhaler Inhale into the lungs. 05/11/14   [provider]  ALPRAZolam Duanne Moron) 0.5 MG tablet Take 1 tablet (0.5 mg total) by mouth daily as needed for anxiety. 02/07/18   Steele Sizer, MD  busPIRone (BUSPAR) 5 MG tablet Take 1 tablet (5 mg total) by mouth 3 (three) times daily. 02/07/18   Steele Sizer, MD  citalopram (CELEXA) 20 MG tablet Take 1 tablet (20 mg total) by mouth daily. 02/07/18   Steele Sizer, MD  nitrofurantoin, macrocrystal-monohydrate, (MACROBID) 100 MG capsule Take 100 mg by mouth 2 (two)  times daily.    Murrell Redden, MD    No Known Allergies  Family History  Problem Relation Age of Onset  . Healthy Mother   . Healthy Father   . Breast cancer Neg Hx     Social History Social History   Tobacco Use  . Smoking status: Former Smoker    Packs/day: 0.50    Years: 12.00    Pack years: 6.00    Types: Cigarettes    Last attempt to quit: 11/03/2012    Years since quitting: 5.3  . Smokeless tobacco: Never Used  Substance Use Topics  . Alcohol use: No    Alcohol/week: 0.0 oz  . Drug use: No    Review of Systems Constitutional: Negative for fever. Eyes: Negative for visual complaints ENT: Negative for recent illness/congestion Cardiovascular: Negative for chest pain. Respiratory: Negative for shortness of breath. Gastrointestinal: Diffuse abdominal pain.  Positive for nausea.  Negative for vomiting or diarrhea Genitourinary: Negative for dysuria or hematuria.  Negative for vaginal bleeding or discharge. Musculoskeletal: Negative for musculoskeletal complaints Skin: Negative for skin complaints  Neurological: Negative for headache All other ROS negative  ____________________________________________   PHYSICAL EXAM:  VITAL SIGNS: ED Triage Vitals [02/28/18 0605]  Enc Vitals Group     BP (!) 147/87     Pulse Rate 82     Resp 20     Temp 98.3 F (36.8 C)     Temp Source Oral     SpO2 98 %  Weight 135 lb (61.2 kg)     Height 5\' 6"  (1.676 m)     Head Circumference      Peak Flow      Pain Score 10     Pain Loc      Pain Edu?      Excl. in [place]?    Constitutional: Alert and oriented. Well appearing and in no distress. Eyes: Normal exam ENT   Head: Normocephalic and atraumatic.   Mouth/Throat: Mucous membranes are moist. Cardiovascular: Normal rate, regular rhythm. No murmur Respiratory: Normal respiratory effort without tachypnea nor retractions. Breath sounds are clear Gastrointestinal: Soft, moderate diffuse abdominal tenderness  palpation.  No rebound or guarding.  No distention. Musculoskeletal: Nontender with normal range of motion in all extremities. Neurologic:  Normal speech and language. No gross focal neurologic deficits Skin:  Skin is warm, dry and intact.  Psychiatric: Mood and affect are normal.  ____________________________________________     RADIOLOGY  IMPRESSION: 1. Normal appendix. 2. Small amount of pelvic free fluid with a focal 8 mm area of hyperdensity along the right aspect likely reflecting a small amount of blood products which may be result of a ruptured hemorrhagic cyst 3. Large amount of stool in the cecum. Cecum is located medially into the lower pelvis.  ____________________________________________   INITIAL IMPRESSION / ASSESSMENT AND PLAN / ED COURSE  Pertinent labs & imaging results that were available during my care of the patient were reviewed by me and considered in my medical decision making (see chart for details).  Patient presents to the emergency department for generalized abdominal pain beginning around 3:00 this morning.  Moderate diffuse tenderness to palpation.  No rebound or guarding, no distention.  Differential is quite broad but would include infectious etiology such as appendicitis, cholecystitis, colitis or diverticulitis, urinary tract infection.  Could also include things such as ovarian cyst, ureterolithiasis, pancreatitis.  We will check abdominal labs, treat pain and nausea and continue to closely monitor.  Patient's labs are largely within normal limits including a normal urinalysis normal blood work, normal white blood cell count.  Will obtain a CT scan given the moderate diffuse abdominal tenderness.  Patient agreeable to this plan of care.  CT scan shows small amount of free fluid in the pelvis with hyperdensity likely reflecting ruptured hemorrhagic cyst, this would explain the patient's discomfort.  Large amount of stool in the cecum, this could also  explain her discomfort.  We will place the patient on pain medication, Colace and MiraLAX, we will have the patient follow-up with her doctor.  I discussed return precautions for any worsening pain or fever.  ____________________________________________   FINAL CLINICAL IMPRESSION(S) / ED DIAGNOSES  Diffuse abdominal pain Hemorrhagic cyst Constipation   Harvest Dark, MD 02/28/18 1002    Harvest Dark, MD 02/28/18 1002

## 2018-03-09 DIAGNOSIS — H40033 Anatomical narrow angle, bilateral: Secondary | ICD-10-CM | POA: Diagnosis not present

## 2018-04-01 DIAGNOSIS — H40033 Anatomical narrow angle, bilateral: Secondary | ICD-10-CM | POA: Diagnosis not present

## 2018-04-12 DIAGNOSIS — H40033 Anatomical narrow angle, bilateral: Secondary | ICD-10-CM | POA: Diagnosis not present

## 2018-08-01 ENCOUNTER — Telehealth: Payer: Self-pay | Admitting: Family Medicine

## 2018-08-01 DIAGNOSIS — F411 Generalized anxiety disorder: Secondary | ICD-10-CM

## 2018-08-01 NOTE — Telephone Encounter (Signed)
Refill request for general medication. Celexa to [facility].   Last office visit 02/07/2018   Follow up on 08/10/2018

## 2018-08-01 NOTE — Telephone Encounter (Signed)
Patient needs follow up, last seen March 2019

## 2018-08-02 NOTE — Telephone Encounter (Signed)
Pt stated that she has enough medication till her appt next week

## 2018-08-10 ENCOUNTER — Encounter: Payer: Self-pay | Admitting: Family Medicine

## 2018-08-10 ENCOUNTER — Ambulatory Visit (INDEPENDENT_AMBULATORY_CARE_PROVIDER_SITE_OTHER): Payer: BLUE CROSS/BLUE SHIELD | Admitting: Family Medicine

## 2018-08-10 VITALS — BP 106/70 | HR 78 | Temp 98.5°F | Resp 14 | Ht 66.0 in | Wt 140.3 lb

## 2018-08-10 DIAGNOSIS — N3090 Cystitis, unspecified without hematuria: Secondary | ICD-10-CM

## 2018-08-10 DIAGNOSIS — Z1159 Encounter for screening for other viral diseases: Secondary | ICD-10-CM

## 2018-08-10 DIAGNOSIS — Z0000 Encounter for general adult medical examination without abnormal findings: Secondary | ICD-10-CM | POA: Diagnosis not present

## 2018-08-10 DIAGNOSIS — Z23 Encounter for immunization: Secondary | ICD-10-CM

## 2018-08-10 DIAGNOSIS — J069 Acute upper respiratory infection, unspecified: Secondary | ICD-10-CM

## 2018-08-10 DIAGNOSIS — F411 Generalized anxiety disorder: Secondary | ICD-10-CM

## 2018-08-10 DIAGNOSIS — Z131 Encounter for screening for diabetes mellitus: Secondary | ICD-10-CM | POA: Diagnosis not present

## 2018-08-10 MED ORDER — CITALOPRAM HYDROBROMIDE 20 MG PO TABS
20.0000 mg | ORAL_TABLET | Freq: Every day | ORAL | 4 refills | Status: DC
Start: 2018-08-10 — End: 2019-08-10

## 2018-08-10 NOTE — Patient Instructions (Signed)
Preventive Care 40-64 Years, Female Preventive care refers to lifestyle choices and visits with your health care provider that can promote health and wellness. What does preventive care include?  A yearly physical exam. This is also called an annual well check.  Dental exams once or twice a year.  Routine eye exams. Ask your health care provider how often you should have your eyes checked.  Personal lifestyle choices, including: ? Daily care of your teeth and gums. ? Regular physical activity. ? Eating a healthy diet. ? Avoiding tobacco and drug use. ? Limiting alcohol use. ? Practicing safe sex. ? Taking low-dose aspirin daily starting at age 58. ? Taking vitamin and mineral supplements as recommended by your health care provider. What happens during an annual well check? The services and screenings done by your health care provider during your annual well check will depend on your age, overall health, lifestyle risk factors, and family history of disease. Counseling Your health care provider may ask you questions about your:  Alcohol use.  Tobacco use.  Drug use.  Emotional well-being.  Home and relationship well-being.  Sexual activity.  Eating habits.  Work and work Statistician.  Method of birth control.  Menstrual cycle.  Pregnancy history.  Screening You may have the following tests or measurements:  Height, weight, and BMI.  Blood pressure.  Lipid and cholesterol levels. These may be checked every 5 years, or more frequently if you are over 81 years old.  Skin check.  Lung cancer screening. You may have this screening every year starting at age 78 if you have a 30-pack-year history of smoking and currently smoke or have quit within the past 15 years.  Fecal occult blood test (FOBT) of the stool. You may have this test every year starting at age 65.  Flexible sigmoidoscopy or colonoscopy. You may have a sigmoidoscopy every 5 years or a colonoscopy  every 10 years starting at age 30.  Hepatitis C blood test.  Hepatitis B blood test.  Sexually transmitted disease (STD) testing.  Diabetes screening. This is done by checking your blood sugar (glucose) after you have not eaten for a while (fasting). You may have this done every 1-3 years.  Mammogram. This may be done every 1-2 years. Talk to your health care provider about when you should start having regular mammograms. This may depend on whether you have a family history of breast cancer.  BRCA-related cancer screening. This may be done if you have a family history of breast, ovarian, tubal, or peritoneal cancers.  Pelvic exam and Pap test. This may be done every 3 years starting at age 80. Starting at age 36, this may be done every 5 years if you have a Pap test in combination with an HPV test.  Bone density scan. This is done to screen for osteoporosis. You may have this scan if you are at high risk for osteoporosis.  Discuss your test results, treatment options, and if necessary, the need for more tests with your health care provider. Vaccines Your health care provider may recommend certain vaccines, such as:  Influenza vaccine. This is recommended every year.  Tetanus, diphtheria, and acellular pertussis (Tdap, Td) vaccine. You may need a Td booster every 10 years.  Varicella vaccine. You may need this if you have not been vaccinated.  Zoster vaccine. You may need this after age 5.  Measles, mumps, and rubella (MMR) vaccine. You may need at least one dose of MMR if you were born in  1957 or later. You may also need a second dose.  Pneumococcal 13-valent conjugate (PCV13) vaccine. You may need this if you have certain conditions and were not previously vaccinated.  Pneumococcal polysaccharide (PPSV23) vaccine. You may need one or two doses if you smoke cigarettes or if you have certain conditions.  Meningococcal vaccine. You may need this if you have certain  conditions.  Hepatitis A vaccine. You may need this if you have certain conditions or if you travel or work in places where you may be exposed to hepatitis A.  Hepatitis B vaccine. You may need this if you have certain conditions or if you travel or work in places where you may be exposed to hepatitis B.  Haemophilus influenzae type b (Hib) vaccine. You may need this if you have certain conditions.  Talk to your health care provider about which screenings and vaccines you need and how often you need them. This information is not intended to replace advice given to you by your health care provider. Make sure you discuss any questions you have with your health care provider. Document Released: 12/06/2015 Document Revised: 07/29/2016 Document Reviewed: 09/10/2015 Elsevier Interactive Patient Education  2018 [employer]  

## 2018-08-10 NOTE — Progress Notes (Signed)
Name: Rachel Caldwell   MRN: [id number]    DOB: 10-27-1971   Date:08/10/2018       Progress Note  Subjective  Chief Complaint  Chief Complaint  Patient presents with  . Annual Exam    patient sleeps about 6-8 hours per night. patient eats a well balanced diet  . Anxiety  . Nasal Congestion    patient woke up this morning with lots of head congestion  . Immunizations    flu shot  . Labs Only    fasting    HPI   Patient presents for annual CPE and follow up  GAD: doing well, off alprazolam, states sleep is better, wakes up when he puppy gets up. She states citalopram works well for her, feeling empowered because she was able to stop alprazolam and did not use buspar. GAD has improved.   URI: she states she woke up under the weather, feeling congested, some rhinorrhea, no sore throat or fever, appetite is poor. No cough or wheezing   Recurrent UTI: sees urologist, has antibiotics at home and takes prn, no recent episodes or dysuria or hematuria, denies back pain   USPSTF grade A and B recommendations    Office Visit from 08/10/2018 in [facility]  AUDIT-C Score  0     Depression:  Depression screen [facility] 2/9 08/10/2018 02/07/2018 11/03/2017 08/05/2017 05/06/2017  Decreased Interest 0 0 0 0 0  Down, Depressed, Hopeless 0 0 0 0 0  PHQ - 2 Score 0 0 0 0 0  Altered sleeping 0 1 - - -  Tired, decreased energy 0 0 - - -  Change in appetite 0 0 - - -  Feeling bad or failure about yourself  0 0 - - -  Trouble concentrating 0 1 - - -  Moving slowly or fidgety/restless 0 0 - - -  Suicidal thoughts 0 0 - - -  PHQ-9 Score 0 2 - - -   Hypertension: BP Readings from Last 3 Encounters:  02/28/18 133/84  02/07/18 104/70  11/03/17 110/64   Obesity: Wt Readings from Last 3 Encounters:  08/10/18 140 lb 4.8 oz (63.6 kg)  02/28/18 135 lb (61.2 kg)  02/07/18 135 lb 14.4 oz (61.6 kg)   BMI Readings from Last 3 Encounters:  08/10/18 22.65 kg/m  02/28/18 21.79 kg/m   02/07/18 21.93 kg/m    Hep C Screening: today  STD testing and prevention (HIV/chl/gon/syphilis): not interested  Intimate partner violence:negative Sexual History/Pain during Intercourse: no pain during intercourse Menstrual History/LMP/Abnormal Bleeding: s/p hysterectomy  Incontinence Symptoms: she sees urologist  Advanced Care Planning: A voluntary discussion about advance care planning including the explanation and discussion of advance directives.  Discussed health care proxy and Living will, and the patient was able to identify a health care proxy as husband.  Patient does not have a living will at present time.   Breast cancer: up to date BRCA gene screening: N/A Cervical cancer screening: not indicated, had hysterectomy at age 31 for benign causes, for heavy bleeding   Osteoporosis Screening:  No results found for: HMDEXASCAN  Lipids:  Lab Results  Component Value Date   CHOL 141 05/06/2017   CHOL 175 05/04/2016   Lab Results  Component Value Date   HDL 58 05/06/2017   HDL 60 05/04/2016   Lab Results  Component Value Date   LDLCALC 69 05/06/2017   [place] 85 05/04/2016   Lab Results  Component Value Date   TRIG 68  05/06/2017   TRIG 152 (H) 05/04/2016   Lab Results  Component Value Date   CHOLHDL 2.4 05/06/2017   CHOLHDL 2.9 05/04/2016   No results found for: LDLDIRECT  Glucose:  Glucose  Date Value Ref Range Status  05/04/2016 81 65 - 99 mg/dL Final   Glucose, Bld  Date Value Ref Range Status  02/28/2018 108 (H) 65 - 99 mg/dL Final  05/06/2017 89 65 - 99 mg/dL Final  12/02/2006 85 70 - 99 mg/dL Final    Skin cancer: discussed atypical lesions    Patient Active Problem List   Diagnosis Date Noted  . Mild intermittent asthma 05/11/2014  . Generalized anxiety disorder 12/01/2007  . Recurrent cystitis 12/01/2007    Past Surgical History:  Procedure Laterality Date  . ABDOMINAL HYSTERECTOMY  2005    Family History  Problem Relation Age  of Onset  . Hypercholesterolemia Mother   . Hypertension Father   . Breast cancer Neg Hx     Social History   Socioeconomic History  . Marital status: Married    Spouse name: Broadus John   . Number of children: 0  . Years of education: Not on file  . Highest education level: 12th grade  Occupational History  . Occupation: fund transfer   Social Needs  . Financial resource strain: Not hard at all  . Food insecurity:    Worry: Never true    Inability: Never true  . Transportation needs:    Medical: No    Non-medical: No  Tobacco Use  . Smoking status: Former Smoker    Packs/day: 0.50    Years: 12.00    Pack years: 6.00    Types: Cigarettes    Last attempt to quit: 11/03/2012    Years since quitting: 5.7  . Smokeless tobacco: Never Used  Substance and Sexual Activity  . Alcohol use: No    Alcohol/week: 0.0 standard drinks  . Drug use: No  . Sexual activity: Yes    Partners: Male  Lifestyle  . Physical activity:    Days per week: 3 days    Minutes per session: 90 min  . Stress: To some extent  Relationships  . Social connections:    Talks on phone: More than three times a week    Gets together: Three times a week    Attends religious service: More than 4 times per year    Active member of club or organization: No    Attends meetings of clubs or organizations: Never    Relationship status: Married  . Intimate partner violence:    Fear of current or ex partner: No    Emotionally abused: No    Physically abused: No    Forced sexual activity: No  Other Topics Concern  . Not on file  Social History Narrative   Married, never had children secondary to gyn problems and two miscarriages, has one dog      Current Outpatient Medications:  .  citalopram (CELEXA) 20 MG tablet, Take 1 tablet (20 mg total) by mouth daily., Disp: 90 tablet, Rfl: 4 .  nitrofurantoin, macrocrystal-monohydrate, (MACROBID) 100 MG capsule, Take 100 mg by mouth 2 (two) times daily. Take for 7  days., Disp: , Rfl:  .  polyethylene glycol powder (GLYCOLAX/MIRALAX) powder, Take 17 g by mouth daily as needed for mild constipation., Disp: 255 g, Rfl: 0  No Known Allergies   ROS  Constitutional: Negative for fever or weight change.  Respiratory: Negative for cough and shortness  of breath.   Cardiovascular: Negative for chest pain or palpitations.  Gastrointestinal: Negative for abdominal pain, no bowel changes.  Musculoskeletal: Negative for gait problem or joint swelling.  Skin: Negative for rash.  Neurological: Negative for dizziness or headache.  No other specific complaints in a complete review of systems (except as listed in HPI above).  Objective  Vitals:   08/10/18 1522  Pulse: 78  Resp: 14  Temp: 98.5 F (36.9 C)  TempSrc: Oral  SpO2: 99%  Weight: 140 lb 4.8 oz (63.6 kg)  Height: _0  (1.676 m)    Body mass index is 22.65 kg/m.  Physical Exam  Constitutional: Patient appears well-developed and well-nourished. No distress.  HENT: Head: Normocephalic and atraumatic. Ears: B TMs ok, no erythema or effusion; Nose: Nose normal. Mouth/Throat: Oropharynx is clear and moist. No oropharyngeal exudate.  Eyes: Conjunctivae and EOM are normal. Pupils are equal, round, and reactive to light. No scleral icterus.  Neck: Normal range of motion. Neck supple. No JVD present. No thyromegaly present.  Cardiovascular: Normal rate, regular rhythm and normal heart sounds.  No murmur heard. No BLE edema. Pulmonary/Chest: Effort normal and breath sounds normal. No respiratory distress. Abdominal: Soft. Bowel sounds are normal, no distension. There is no tenderness. no masses Breast: no lumps or masses, no nipple discharge or rashes FEMALE GENITALIA:  Not done RECTAL: not done Musculoskeletal: Normal range of motion, no joint effusions. No gross deformities Neurological: he is alert and oriented to person, place, and time. No cranial nerve deficit. Coordination, balance, strength,  speech and gait are normal.  Skin: Skin is warm and dry. No rash noted. No erythema.  Psychiatric: Patient has a normal mood and affect. behavior is normal. Judgment and thought content normal.  PHQ2/9: Depression screen [facility] 2/9 08/10/2018 02/07/2018 11/03/2017 08/05/2017 05/06/2017  Decreased Interest 0 0 0 0 0  Down, Depressed, Hopeless 0 0 0 0 0  PHQ - 2 Score 0 0 0 0 0  Altered sleeping 0 1 - - -  Tired, decreased energy 0 0 - - -  Change in appetite 0 0 - - -  Feeling bad or failure about yourself  0 0 - - -  Trouble concentrating 0 1 - - -  Moving slowly or fidgety/restless 0 0 - - -  Suicidal thoughts 0 0 - - -  PHQ-9 Score 0 2 - - -     Fall Risk: Fall Risk  02/07/2018 11/03/2017 08/05/2017 05/06/2017 03/15/2017  Falls in the past year? _1      Functional Status Survey: Is the patient deaf or have difficulty hearing?: No Does the patient have difficulty seeing, even when wearing glasses/contacts?: No Does the patient have difficulty concentrating, remembering, or making decisions?: No Does the patient have difficulty walking or climbing stairs?: No Does the patient have difficulty dressing or bathing?: No Does the patient have difficulty doing errands alone such as visiting a doctor's office or shopping?: No   Assessment & Plan  1. Well woman exam (no gynecological exam)   2. Need for influenza vaccination  - Flu Vaccine QUAD 6+ mos PF IM (Fluarix Quad PF)  3. Generalized anxiety disorder  - citalopram (CELEXA) 20 MG tablet; Take 1 tablet (20 mg total) by mouth daily.  Dispense: 90 tablet; Refill: 4  4. Recurrent cystitis  Going to see Dr. Bernardo Heater   5. Diabetes mellitus screening  - Hemoglobin A1c  7. Encounter for hepatitis C screening test for low risk  patient  - Hepatitis C Antibody  -USPSTF grade A and B recommendations reviewed with patient; age-appropriate recommendations, preventive care, screening tests, etc discussed and encouraged;  healthy living encouraged; see AVS for patient education given to patient -Discussed importance of 150 minutes of physical activity weekly, eat two servings of fish weekly, eat one serving of tree nuts ( cashews, pistachios, pecans, almonds.Marland Kitchen) every other day, eat 6 servings of fruit/vegetables daily and drink plenty of water and avoid sweet beverages.

## 2018-08-11 LAB — HEPATITIS C ANTIBODY
Hepatitis C Ab: NONREACTIVE
SIGNAL TO CUT-OFF: 0.01 (ref <1.00)

## 2018-08-11 LAB — HEMOGLOBIN A1C
HEMOGLOBIN A1C: 5.5 %{Hb} (ref <5.7)
Mean Plasma Glucose: 111 (calc)
eAG (mmol/L): 6.2 (calc)

## 2018-08-20 DIAGNOSIS — J0110 Acute frontal sinusitis, unspecified: Secondary | ICD-10-CM | POA: Diagnosis not present

## 2018-09-21 ENCOUNTER — Encounter: Payer: Self-pay | Admitting: Urology

## 2018-09-21 ENCOUNTER — Ambulatory Visit: Payer: BLUE CROSS/BLUE SHIELD | Admitting: Urology

## 2018-09-21 VITALS — BP 136/77 | HR 81 | Ht 66.0 in | Wt 141.0 lb

## 2018-09-21 DIAGNOSIS — N3090 Cystitis, unspecified without hematuria: Secondary | ICD-10-CM

## 2018-09-21 LAB — URINALYSIS, COMPLETE
BILIRUBIN UA: NEGATIVE
Glucose, UA: NEGATIVE
Ketones, UA: NEGATIVE
Leukocytes, UA: NEGATIVE
Nitrite, UA: NEGATIVE
PH UA: 6.5 (ref 5.0–7.5)
Protein, UA: NEGATIVE
RBC UA: NEGATIVE
Specific Gravity, UA: 1.015 (ref 1.005–1.030)
UUROB: 1 mg/dL (ref 0.2–1.0)

## 2018-09-21 LAB — MICROSCOPIC EXAMINATION: WBC UA: NONE SEEN /HPF (ref 0–5)

## 2018-09-21 NOTE — Progress Notes (Signed)
09/21/2018 3:59 PM   Rachel Caldwell 01/14/1971 [id number]  Referring provider: Steele Sizer, MD [address] [place], [place] [address]  Chief Complaint  Patient presents with  . Recurrent UTI    HPI: 47 year old female presents to establish local urologic care.  She has been followed by Dr. Jacqlyn Larsen for several years for recurrent UTI.  She has most recently been on demand therapy with nitrofurantoin.  She last saw Dr. Jacqlyn Larsen in October 2018.  She utilized the antibiotic on 4 occasions with resolution of her symptoms.  She is presently asymptomatic.   PMH: Past Medical History:  Diagnosis Date  . Anxiety     Surgical History: Past Surgical History:  Procedure Laterality Date  . ABDOMINAL HYSTERECTOMY  2005    Home Medications:  Allergies as of 09/21/2018   No Known Allergies     Medication List        Accurate as of 09/21/18  3:59 PM. Always use your most recent med list.          citalopram 20 MG tablet Commonly known as:  CELEXA Take 1 tablet (20 mg total) by mouth daily.   nitrofurantoin (macrocrystal-monohydrate) 100 MG capsule Commonly known as:  MACROBID Take 100 mg by mouth 2 (two) times daily. Take for 7 days.       Allergies: No Known Allergies  Family History: Family History  Problem Relation Age of Onset  . Hypercholesterolemia Mother   . Hypertension Father   . Breast cancer Neg Hx     Social History:  reports that she quit smoking about 5 years ago. Her smoking use included cigarettes. She has a 6.00 pack-year smoking history. She has never used smokeless tobacco. She reports that she does not drink alcohol or use drugs.  ROS: UROLOGY Frequent Urination?: No Hard to postpone urination?: No Burning/pain with urination?: No Get up at night to urinate?: No Leakage of urine?: No Urine stream starts and stops?: No Trouble starting stream?: No Do you have to strain to urinate?: No Blood in urine?: No Urinary tract  infection?: No Sexually transmitted disease?: No Injury to kidneys or bladder?: No Painful intercourse?: No Weak stream?: No Currently pregnant?: No Vaginal bleeding?: No Last menstrual period?: Hysterectomy  Gastrointestinal Nausea?: No Vomiting?: No Indigestion/heartburn?: No Diarrhea?: No Constipation?: No  Constitutional Fever: No Night sweats?: No Weight loss?: No Fatigue?: No  Skin Skin rash/lesions?: No Itching?: No  Eyes Blurred vision?: No Double vision?: No  Ears/Nose/Throat Sore throat?: No Sinus problems?: No  Hematologic/Lymphatic Swollen glands?: No Easy bruising?: No  Cardiovascular Leg swelling?: No Chest pain?: No  Respiratory Cough?: No Shortness of breath?: No  Endocrine Excessive thirst?: No  Musculoskeletal Back pain?: No Joint pain?: No  Neurological Headaches?: No Dizziness?: No  Psychologic Depression?: No Anxiety?: No  Physical Exam: BP 136/77 (BP Location: Left Arm, Patient Position: Sitting, Cuff Size: Normal)   Pulse 81   Ht 5\' 6"  (1.676 m)   Wt 141 lb (64 kg)   BMI 22.76 kg/m   Constitutional:  Alert and oriented, No acute distress. HEENT: [place] AT, moist mucus membranes.  Trachea midline, no masses. Cardiovascular: No clubbing, cyanosis, or edema. Respiratory: Normal respiratory effort, no increased work of breathing. GI: Abdomen is soft, nontender, nondistended, no abdominal masses GU: No CVA tenderness Lymph: No cervical or inguinal lymphadenopathy. Skin: No rashes, bruises or suspicious lesions. Neurologic: Grossly intact, no focal deficits, moving all 4 extremities. Psychiatric: Normal mood and affect.  Laboratory Data:  Urinalysis Dipstick/microscopy negative   Assessment & Plan:   47 year old female with recurrent lower tract UTIs on demand therapy.  Urinalysis today was clear.  She did not need medication refill.  Continue annual follow-up.   Return in about 1 year (around 09/22/2019) for  Recheck.  Abbie Sons, [place] [address] [place], [place] [address] [phone]

## 2018-09-22 ENCOUNTER — Encounter: Payer: Self-pay | Admitting: Urology

## 2018-09-24 LAB — CULTURE, URINE COMPREHENSIVE

## 2018-12-09 DIAGNOSIS — R599 Enlarged lymph nodes, unspecified: Secondary | ICD-10-CM | POA: Diagnosis not present

## 2018-12-22 DIAGNOSIS — L700 Acne vulgaris: Secondary | ICD-10-CM | POA: Diagnosis not present

## 2019-01-13 DIAGNOSIS — Z13220 Encounter for screening for lipoid disorders: Secondary | ICD-10-CM | POA: Diagnosis not present

## 2019-01-13 DIAGNOSIS — Z136 Encounter for screening for cardiovascular disorders: Secondary | ICD-10-CM | POA: Diagnosis not present

## 2019-01-13 DIAGNOSIS — Z713 Dietary counseling and surveillance: Secondary | ICD-10-CM | POA: Diagnosis not present

## 2019-01-13 DIAGNOSIS — Z6823 Body mass index (BMI) 23.0-23.9, adult: Secondary | ICD-10-CM | POA: Diagnosis not present

## 2019-02-20 ENCOUNTER — Telehealth: Payer: Self-pay | Admitting: Urology

## 2019-02-20 ENCOUNTER — Other Ambulatory Visit: Payer: Self-pay | Admitting: Family Medicine

## 2019-02-20 MED ORDER — NITROFURANTOIN MONOHYD MACRO 100 MG PO CAPS: 100.0000 mg | ORAL_CAPSULE | Freq: Every day | ORAL | 3 refills | Status: DC | PRN

## 2019-02-20 MED ORDER — NITROFURANTOIN MONOHYD MACRO 100 MG PO CAPS: 100.0000 mg | ORAL_CAPSULE | Freq: Two times a day (BID) | ORAL | 3 refills | Status: DC

## 2019-02-20 NOTE — Telephone Encounter (Signed)
Rx sent to pharmacy, patient notified. 

## 2019-02-20 NOTE — Telephone Encounter (Signed)
Pt needs a new RX for Nitrofurantoin.  She was a Advertising account executive told her to call when she ran out of RX prescribed by [employer].  She uses [facility] in [place].

## 2019-03-08 IMAGING — CT CT ABD-PELV W/ CM
2 of 5 series · 16 of 46 positions shown, 18 images · IV contrast (APPLIED)
Comparison: None.

CLINICAL DATA: Abdominal pain, inguinal pain

EXAM:
CT ABDOMEN AND PELVIS WITH CONTRAST
TECHNIQUE: Multidetector CT imaging of the abdomen and pelvis was performed
using the standard protocol following bolus administration of
intravenous contrast.
CONTRAST:  100mL 2DD219-JCC IOPAMIDOL (2DD219-JCC) INJECTION 61%

[Series 2: routine abd/pel with · axial · 0.61mm/px · z∈[-477,-102]mm · 13 of 85 slices shown, 15 images]
[im 5/85  soft-tissue]
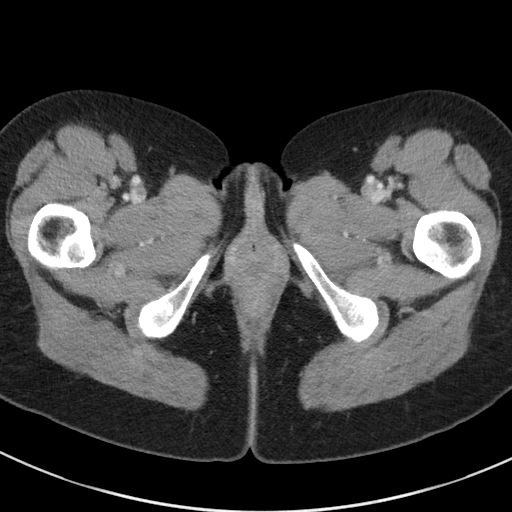
[im 5/85  bone]
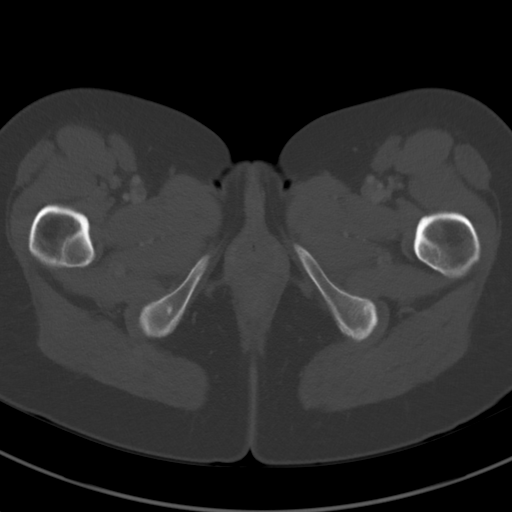
[im 14/85  soft-tissue]
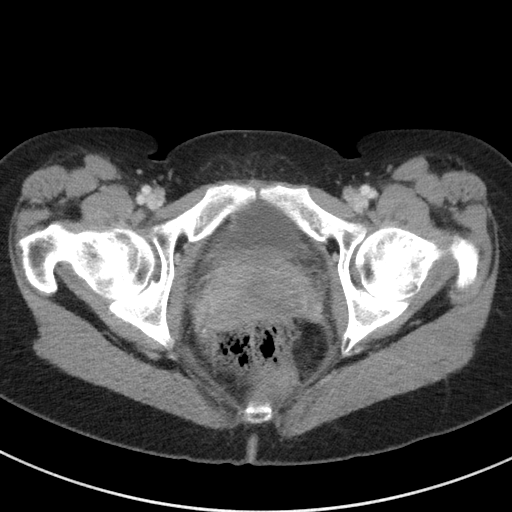
[im 18/85  soft-tissue]
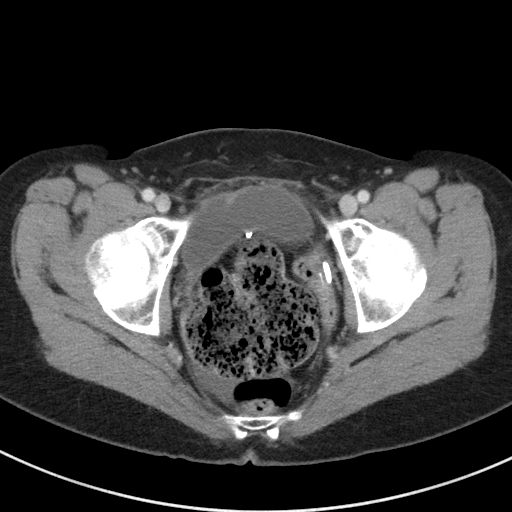
[im 23/85  soft-tissue]
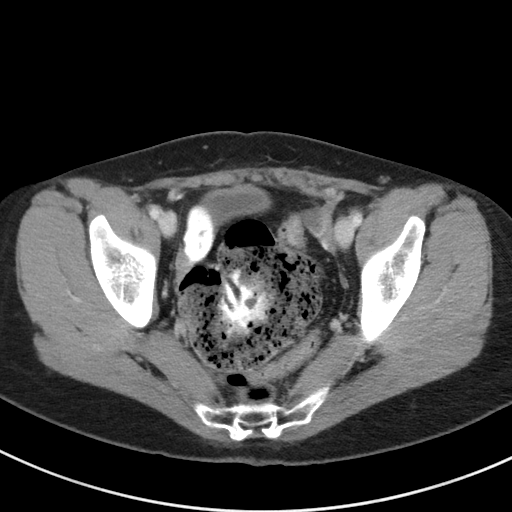
[im 31/85  soft-tissue]
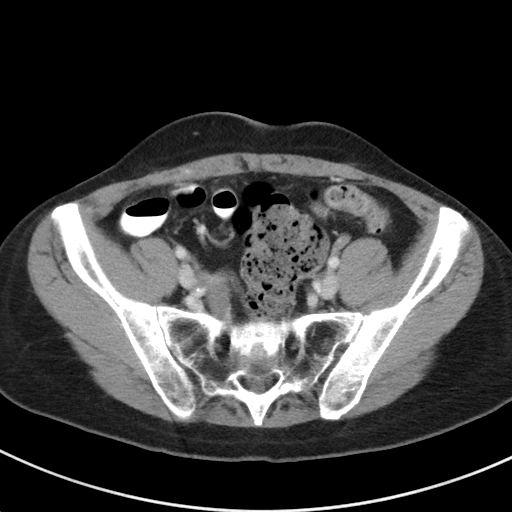
[im 36/85  soft-tissue]
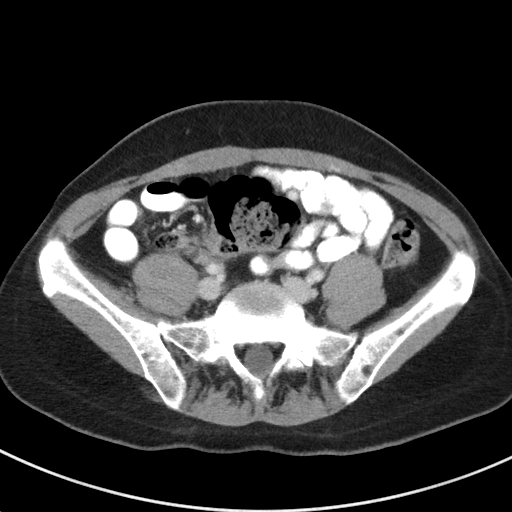
[im 45/85  soft-tissue]
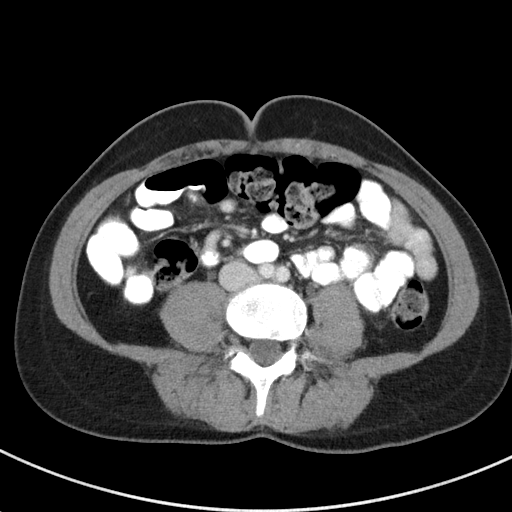
[im 49/85  soft-tissue]
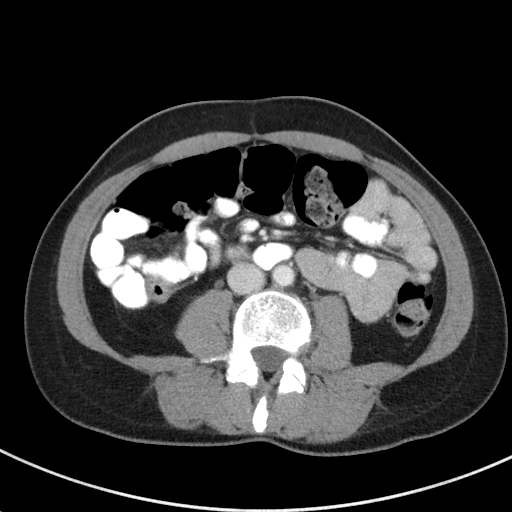
[im 54/85  soft-tissue]
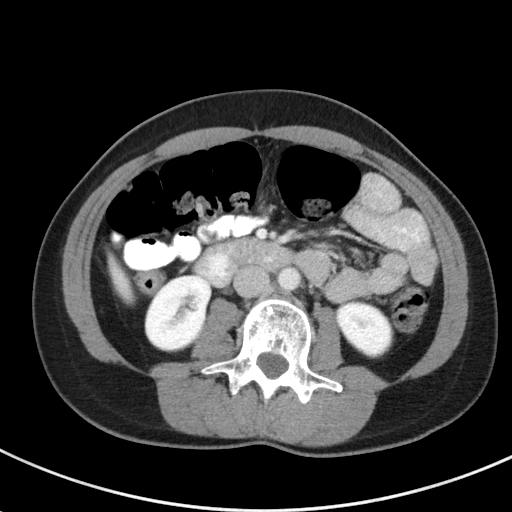
[im 54/85  bone]
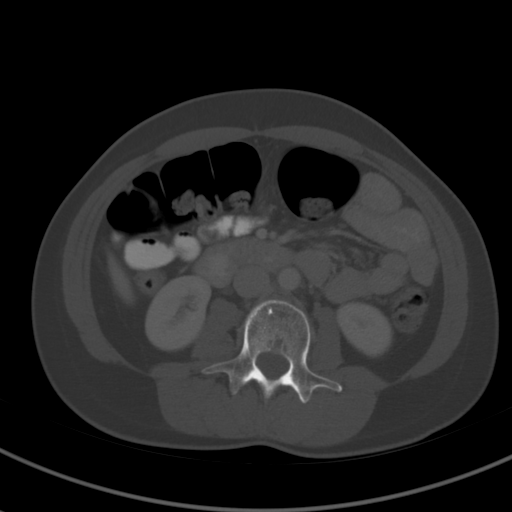
[im 62/85  soft-tissue]
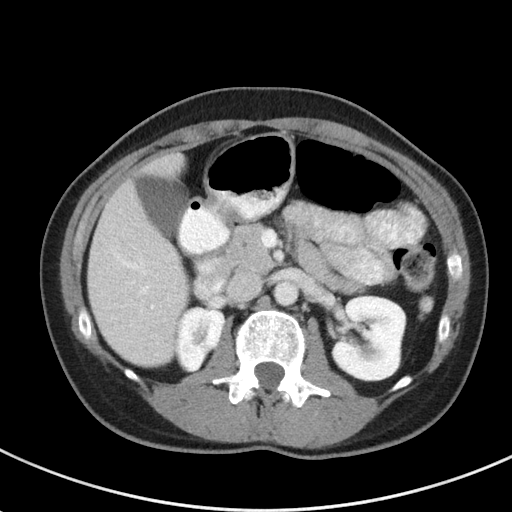
[im 67/85  soft-tissue]
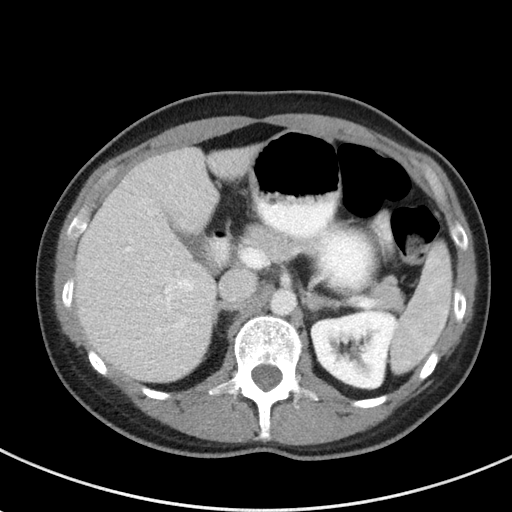
[im 71/85  soft-tissue]
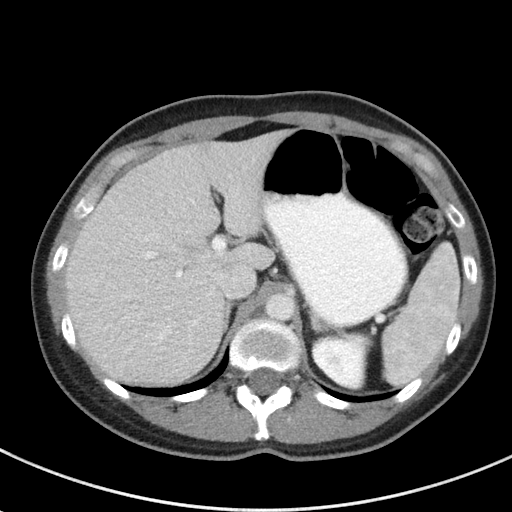
[im 80/85  soft-tissue]
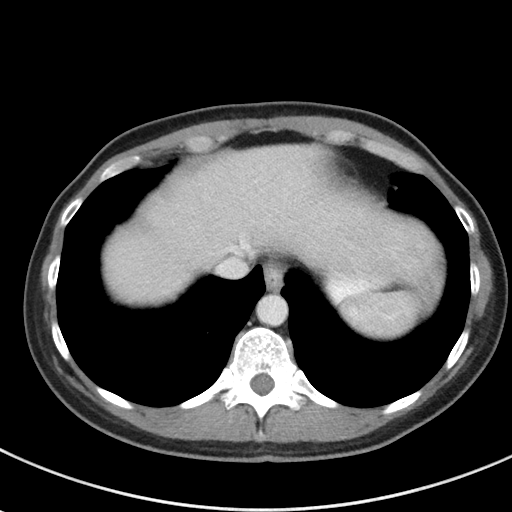

[Series 5: coronal st · coronal · 0.63mm/px · 3 of 79 slices shown]
[im 27/79  soft-tissue]
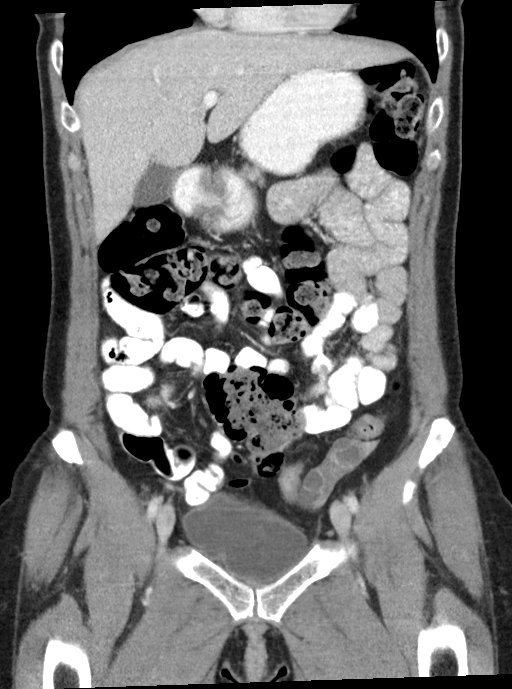
[im 35/79  soft-tissue]
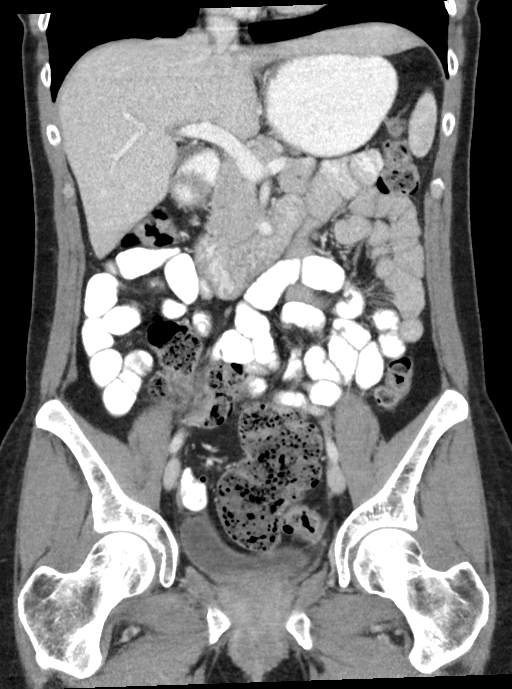
[im 44/79  soft-tissue]
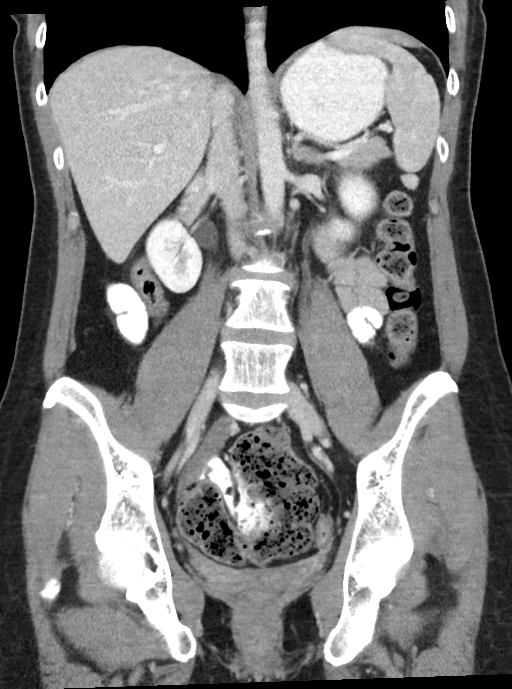

[16 of 46 positions shown; findings below may reference images not displayed]

FINDINGS: Lower chest: No acute abnormality.

Hepatobiliary: No focal liver abnormality is seen. No gallstones,
gallbladder wall thickening, or biliary dilatation.

Pancreas: Unremarkable. No pancreatic ductal dilatation or
surrounding inflammatory changes.

Spleen: Normal in size without focal abnormality.

Adrenals/Urinary Tract: Adrenal glands are unremarkable. Kidneys are
normal, without renal calculi, focal lesion, or hydronephrosis.
Bladder is unremarkable.

Stomach/Bowel: Stomach is within normal limits. Appendix appears
normal. No evidence of bowel wall thickening, distention, or
inflammatory changes. Large amount of stool in the cecum. Cecum is
located medially into the lower pelvis.

Vascular/Lymphatic: No significant vascular findings are present. No
enlarged abdominal or pelvic lymph nodes.

Reproductive: Status post hysterectomy. No adnexal masses.

Other: No abdominal wall hernia or abnormality. Small amount of
pelvic free fluid with a focal 8 mm area of hyperdensity along the
right aspect likely reflecting a small amount of blood products.

Musculoskeletal: No acute or significant osseous findings.
IMPRESSION: 1. Normal appendix.
2. Small amount of pelvic free fluid with a focal 8 mm area of
hyperdensity along the right aspect likely reflecting a small amount
of blood products which may be result of a ruptured hemorrhagic cyst
3. Large amount of stool in the cecum. Cecum is located medially
into the lower pelvis.

## 2019-05-03 ENCOUNTER — Other Ambulatory Visit: Payer: Self-pay | Admitting: Family Medicine

## 2019-05-03 DIAGNOSIS — Z1231 Encounter for screening mammogram for malignant neoplasm of breast: Secondary | ICD-10-CM

## 2019-05-10 ENCOUNTER — Other Ambulatory Visit: Payer: Self-pay

## 2019-05-10 ENCOUNTER — Ambulatory Visit
Admission: RE | Admit: 2019-05-10 | Discharge: 2019-05-10 | Disposition: A | Discharge status: Home / Self Care | Payer: BC Managed Care – PPO | Source: Ambulatory Visit | Attending: Family Medicine | Admitting: Family Medicine

## 2019-05-10 DIAGNOSIS — Z1231 Encounter for screening mammogram for malignant neoplasm of breast: Secondary | ICD-10-CM | POA: Insufficient documentation

## 2019-08-10 ENCOUNTER — Other Ambulatory Visit: Payer: Self-pay | Admitting: Family Medicine

## 2019-08-10 DIAGNOSIS — F411 Generalized anxiety disorder: Secondary | ICD-10-CM

## 2019-08-15 ENCOUNTER — Ambulatory Visit (INDEPENDENT_AMBULATORY_CARE_PROVIDER_SITE_OTHER): Payer: BC Managed Care – PPO | Admitting: Family Medicine

## 2019-08-15 ENCOUNTER — Other Ambulatory Visit: Payer: Self-pay

## 2019-08-15 ENCOUNTER — Encounter: Payer: Self-pay | Admitting: Family Medicine

## 2019-08-15 VITALS — BP 108/62 | HR 78 | Temp 97.1°F | Resp 16 | Ht 66.0 in | Wt 137.1 lb

## 2019-08-15 DIAGNOSIS — Z0000 Encounter for general adult medical examination without abnormal findings: Secondary | ICD-10-CM

## 2019-08-15 DIAGNOSIS — Z23 Encounter for immunization: Secondary | ICD-10-CM | POA: Diagnosis not present

## 2019-08-15 NOTE — Patient Instructions (Signed)

## 2019-08-15 NOTE — Progress Notes (Signed)
Name: Rachel Caldwell   MRN: [id number]    DOB: [birth year]   Date:08/15/2019       Progress Note  Subjective  Chief Complaint  Chief Complaint  Patient presents with  . Well woman exam    HPI  Patient presents for annual CPE.  Diet: balanced diet  Exercise: she is physically active   USPSTF grade A and B recommendations    Office Visit from 08/15/2019 in [facility]  AUDIT-C Score  0     Depression: Phq 9 is  negative Depression screen [facility] 2/9 08/15/2019 08/10/2018 02/07/2018 11/03/2017 08/05/2017  Decreased Interest 0 0 0 0 0  Down, Depressed, Hopeless 0 0 0 0 0  PHQ - 2 Score 0 0 0 0 0  Altered sleeping 0 0 1 - -  Tired, decreased energy 0 0 0 - -  Change in appetite 0 0 0 - -  Feeling bad or failure about yourself  0 0 0 - -  Trouble concentrating 0 0 1 - -  Moving slowly or fidgety/restless 0 0 0 - -  Suicidal thoughts 0 0 0 - -  PHQ-9 Score 0 0 2 - -  Difficult doing work/chores Not difficult at all - - - -   Hypertension: BP Readings from Last 3 Encounters:  08/15/19 108/62  09/21/18 136/77  08/10/18 106/70   Obesity: Wt Readings from Last 3 Encounters:  08/15/19 137 lb 1.6 oz (62.2 kg)  09/21/18 141 lb (64 kg)  08/10/18 140 lb 4.8 oz (63.6 kg)   BMI Readings from Last 3 Encounters:  08/15/19 22.13 kg/m  09/21/18 22.76 kg/m  08/10/18 22.65 kg/m     Hep C Screening: negative screen  STD testing and prevention (HIV/chl/gon/syphilis): N/A - married and not interested  Intimate partner violence: negative screen  Sexual History/Pain during Intercourse: no pain or discomfort, s/p hysterectomy  Menstrual History/LMP/Abnormal Bleeding: no cycles  Incontinence Symptoms: no symptoms   Breast cancer:  - Last Mammogram: done in June  - BRCA gene screening: N/A  Osteoporosis Prevention: discussed high calcium and vitamin D diet   Cervical cancer screening: N/A since had hysterectomy   Skin cancer:discussed atypical lesions  Colorectal  cancer: discussed USPTF start at age 45   Lung cancer:   Low Dose CT Chest recommended if Age 38-80 years, 30 pack-year currently smoking OR have quit w/in 15years. Patient does not qualify.    Advanced Care Planning: A voluntary discussion about advance care planning including the explanation and discussion of advance directives.  Discussed health care proxy and Living will, and the patient was able to identify a health care proxy as husband .  Patient does not have a living will at present time.  Lipids: Lab Results  Component Value Date   CHOL 141 05/06/2017   CHOL 175 05/04/2016   Lab Results  Component Value Date   HDL 58 05/06/2017   HDL 60 05/04/2016   Lab Results  Component Value Date   LDLCALC 69 05/06/2017   LDLCALC 85 05/04/2016   Lab Results  Component Value Date   TRIG 68 05/06/2017   TRIG 152 (H) 05/04/2016   Lab Results  Component Value Date   CHOLHDL 2.4 05/06/2017   CHOLHDL 2.9 05/04/2016   No results found for: LDLDIRECT  Glucose: Glucose  Date Value Ref Range Status  05/04/2016 81 65 - 99 mg/dL Final   Glucose, Bld  Date Value Ref Range Status  02/28/2018 108 (H) 65 -  99 mg/dL Final  05/06/2017 89 65 - 99 mg/dL Final  12/02/2006 85 70 - 99 mg/dL Final    Patient Active Problem List   Diagnosis Date Noted  . Mild intermittent asthma 05/11/2014  . Generalized anxiety disorder 12/01/2007  . Recurrent cystitis 12/01/2007    Past Surgical History:  Procedure Laterality Date  . ABDOMINAL HYSTERECTOMY  2005    Family History  Problem Relation Age of Onset  . Hypercholesterolemia Mother   . Heart block Mother        Put a Stent in  . Hypertension Father   . Breast cancer Neg Hx     Social History   Socioeconomic History  . Marital status: Married    Spouse name: Broadus John   . Number of children: 0  . Years of education: Not on file  . Highest education level: 12th grade  Occupational History  . Occupation: fund transfer   Social  Needs  . Financial resource strain: Not hard at all  . Food insecurity    Worry: Never true    Inability: Never true  . Transportation needs    Medical: No    Non-medical: No  Tobacco Use  . Smoking status: Former Smoker    Packs/day: 0.50    Years: 12.00    Pack years: 6.00    Types: Cigarettes    Start date: 11/24/1999    Quit date: 11/03/2012    Years since quitting: 6.7  . Smokeless tobacco: Never Used  Substance and Sexual Activity  . Alcohol use: No    Alcohol/week: 0.0 standard drinks  . Drug use: No  . Sexual activity: Yes    Partners: Male    Birth control/protection: Surgical  Lifestyle  . Physical activity    Days per week: 3 days    Minutes per session: 90 min  . Stress: Not at all  Relationships  . Social connections    Talks on phone: More than three times a week    Gets together: Three times a week    Attends religious service: More than 4 times per year    Active member of club or organization: No    Attends meetings of clubs or organizations: Never    Relationship status: Married  . Intimate partner violence    Fear of current or ex partner: No    Emotionally abused: No    Physically abused: No    Forced sexual activity: No  Other Topics Concern  . Not on file  Social History Narrative   Married, never had children secondary to gyn problems and two miscarriages, has one dog      Current Outpatient Medications:  .  citalopram (CELEXA) 20 MG tablet, TAKE 1 TABLET(20 MG) BY MOUTH DAILY, Disp: 90 tablet, Rfl: 0 .  doxycycline (ORACEA) 40 MG capsule, TAKE ONE CAPSULE BY MOUTH EVERY DAY WITH FOOD AND WATER, Disp: , Rfl:  .  EPIDUO FORTE 0.3-2.5 % GEL, APPLY thin layer TO FACE AT bedtime; [place] off IN THE MORNING], Disp: , Rfl:  .  nitrofurantoin, macrocrystal-monohydrate, (MACROBID) 100 MG capsule, Take 1 capsule (100 mg total) by mouth daily as needed. Take for 7 days., Disp: 90 capsule, Rfl: 3  No Known Allergies   ROS  Constitutional: Negative  for fever or weight change.  Respiratory: Negative for cough and shortness of breath.   Cardiovascular: Negative for chest pain or palpitations.  Gastrointestinal: Negative for abdominal pain, no bowel changes.  Musculoskeletal: Negative for gait  problem or joint swelling.  Skin: Negative for rash.  Neurological: Negative for dizziness or headache.  No other specific complaints in a complete review of systems (except as listed in HPI above).  Objective  Vitals:   08/15/19 1544  BP: 108/62  Pulse: 78  Resp: 16  Temp: (!) 97.1 F (36.2 C)  TempSrc: Temporal  SpO2: 98%  Weight: 137 lb 1.6 oz (62.2 kg)  Height: _0  (1.676 m)    Body mass index is 22.13 kg/m.  Physical Exam  Constitutional: Patient appears well-developed and well-nourished. No distress.  HENT: Head: Normocephalic and atraumatic. Ears: B TMs ok, no erythema or effusion; Nose: Nose normal. Mouth/Throat: Oropharynx is clear and moist. No oropharyngeal exudate.  Eyes: Conjunctivae and EOM are normal. Pupils are equal, round, and reactive to light. No scleral icterus.  Neck: Normal range of motion. Neck supple. No JVD present. No thyromegaly present.  Cardiovascular: Normal rate, regular rhythm and normal heart sounds.  No murmur heard. No BLE edema. Pulmonary/Chest: Effort normal and breath sounds normal. No respiratory distress. Abdominal: Soft. Bowel sounds are normal, no distension. There is no tenderness. no masses Breast: no lumps or masses, no nipple discharge or rashes FEMALE GENITALIA:  Not done RECTAL: not done Musculoskeletal: Normal range of motion, no joint effusions. No gross deformities Neurological: he is alert and oriented to person, place, and time. No cranial nerve deficit. Coordination, balance, strength, speech and gait are normal.  Skin: Skin is warm and dry. No rash noted. No erythema.  Psychiatric: Patient has a normal mood and affect. behavior is normal. Judgment and thought content  normal.  Fall Risk: Fall Risk  08/15/2019 02/07/2018 11/03/2017 08/05/2017 05/06/2017  Falls in the past year? 0 No No No No  Number falls in past yr: 0 - - - -  Injury with Fall? 0 - - - -     Functional Status Survey: Is the patient deaf or have difficulty hearing?: No Does the patient have difficulty seeing, even when wearing glasses/contacts?: Yes Does the patient have difficulty concentrating, remembering, or making decisions?: No Does the patient have difficulty walking or climbing stairs?: No Does the patient have difficulty dressing or bathing?: No Does the patient have difficulty doing errands alone such as visiting a doctor's office or shopping?: No   Assessment & Plan  1. Well adult exam  Discussed healthy diet, immunizations, we will hold off on labs since last ones have been normal, CT abdomen done in 2019 showed some pelvic fluids, she prefers not getting a pelvic US  2. Need for immunization against influenza  - Flu Vaccine QUAD 36+ mos IM  -USPSTF grade A and B recommendations reviewed with patient; age-appropriate recommendations, preventive care, screening tests, etc discussed and encouraged; healthy living encouraged; see AVS for patient education given to patient -Discussed importance of 150 minutes of physical activity weekly, eat two servings of fish weekly, eat one serving of tree nuts ( cashews, pistachios, pecans, almonds.Marland Kitchen) every other day, eat 6 servings of fruit/vegetables daily and drink plenty of water and avoid sweet beverages.

## 2019-09-22 ENCOUNTER — Ambulatory Visit: Payer: BC Managed Care – PPO | Admitting: Urology

## 2019-11-11 ENCOUNTER — Other Ambulatory Visit: Payer: Self-pay | Admitting: Family Medicine

## 2019-11-11 DIAGNOSIS — F411 Generalized anxiety disorder: Secondary | ICD-10-CM

## 2019-12-28 ENCOUNTER — Ambulatory Visit: Payer: BC Managed Care – PPO | Admitting: Urology

## 2019-12-28 ENCOUNTER — Encounter: Payer: Self-pay | Admitting: Urology

## 2019-12-28 ENCOUNTER — Other Ambulatory Visit: Payer: Self-pay

## 2019-12-28 VITALS — BP 132/86 | HR 94 | Ht 67.0 in | Wt 140.0 lb

## 2019-12-28 DIAGNOSIS — N390 Urinary tract infection, site not specified: Secondary | ICD-10-CM | POA: Diagnosis not present

## 2019-12-28 NOTE — Progress Notes (Signed)
12/28/2019 3:31 PM   Rachel Caldwell 03/04/1971 [id number]  Referring provider: Steele Sizer, MD [address] [place],  [place] [address]  Chief Complaint  Patient presents with  . Cystitis    Urologic history: 1.  Recurrent UTI - demand therapy with Macrobid -<4 UTIs annually  HPI: 49 y.o. female presents for follow-up.  She was last seen October 2019.  She has only had to utilize the [employer] on 2 occasions.  She is presently asymptomatic.   PMH: Past Medical History:  Diagnosis Date  . Anxiety     Surgical History: Past Surgical History:  Procedure Laterality Date  . ABDOMINAL HYSTERECTOMY  2005    Home Medications:  Allergies as of 12/28/2019   No Known Allergies     Medication List       Accurate as of December 28, 2019  3:31 PM. If you have any questions, ask your nurse or doctor.        citalopram 20 MG tablet Commonly known as: CELEXA TAKE 1 TABLET(20 MG) BY MOUTH DAILY   Epiduo Forte 0.3-2.5 % Gel Generic drug: Adapalene-Benzoyl Peroxide APPLY thin layer TO FACE AT bedtime; [place] off IN THE MORNING]   nitrofurantoin (macrocrystal-monohydrate) 100 MG capsule Commonly known as: MACROBID Take 1 capsule (100 mg total) by mouth daily as needed. Take for 7 days.   Oracea 40 MG capsule Generic drug: doxycycline TAKE ONE CAPSULE BY MOUTH EVERY DAY WITH FOOD AND WATER       Allergies: No Known Allergies  Family History: Family History  Problem Relation Age of Onset  . Hypercholesterolemia Mother   . Heart block Mother        Put a Stent in  . Hypertension Father   . Breast cancer Neg Hx     Social History:  reports that she quit smoking about 7 years ago. Her smoking use included cigarettes. She started smoking about 20 years ago. She has a 6.00 pack-year smoking history. She has never used smokeless tobacco. She reports that she does not drink alcohol or use drugs.  ROS: UROLOGY Frequent Urination?: No Hard to postpone  urination?: No Burning/pain with urination?: No Get up at night to urinate?: No Leakage of urine?: No Urine stream starts and stops?: No Trouble starting stream?: No Do you have to strain to urinate?: No Blood in urine?: No Urinary tract infection?: No Sexually transmitted disease?: No Injury to kidneys or bladder?: No Painful intercourse?: No Weak stream?: No Currently pregnant?: No Vaginal bleeding?: No Last menstrual period?: n  Gastrointestinal Nausea?: No Vomiting?: No Indigestion/heartburn?: No Diarrhea?: No Constipation?: No  Constitutional Fever: No Night sweats?: No Weight loss?: No Fatigue?: No  Skin Skin rash/lesions?: No Itching?: No  Eyes Blurred vision?: No Double vision?: No  Ears/Nose/Throat Sore throat?: No Sinus problems?: No  Hematologic/Lymphatic Swollen glands?: No Easy bruising?: No  Cardiovascular Leg swelling?: No Chest pain?: No  Respiratory Cough?: No Shortness of breath?: No  Endocrine Excessive thirst?: No  Musculoskeletal Back pain?: No Joint pain?: No  Neurological Headaches?: No Dizziness?: No  Psychologic Depression?: No Anxiety?: No  Physical Exam: BP 132/86   Pulse 94   Ht 5\' 7"  (1.702 m)   Wt 140 lb (63.5 kg)   BMI 21.93 kg/m   Constitutional:  Alert and oriented, No acute distress. HEENT: [place] AT, moist mucus membranes.  Trachea midline, no masses. Cardiovascular: No clubbing, cyanosis, or edema. Respiratory: Normal respiratory effort, no increased work of breathing. Skin: No rashes,  bruises or suspicious lesions. Neurologic: Grossly intact, no focal deficits, moving all 4 extremities. Psychiatric: Normal mood and affect.   Assessment & Plan:    -Recurrent UTI Urinalysis today was clear.  Macrobid refilled Follow-up appointment when she has used her last refill   Abbie Sons, MD  [facility] [address] [place], [place] [address] [phone]

## 2019-12-29 ENCOUNTER — Encounter: Payer: Self-pay | Admitting: Urology

## 2019-12-29 LAB — MICROSCOPIC EXAMINATION: RBC: NONE SEEN /hpf (ref 0–2)

## 2019-12-29 LAB — URINALYSIS, COMPLETE
Bilirubin, UA: NEGATIVE
Glucose, UA: NEGATIVE
Ketones, UA: NEGATIVE
Leukocytes,UA: NEGATIVE
Nitrite, UA: NEGATIVE
Protein,UA: NEGATIVE
RBC, UA: NEGATIVE
Specific Gravity, UA: 1.02 (ref 1.005–1.030)
Urobilinogen, Ur: 0.2 mg/dL (ref 0.2–1.0)
pH, UA: 6 (ref 5.0–7.5)

## 2019-12-29 MED ORDER — NITROFURANTOIN MONOHYD MACRO 100 MG PO CAPS: ORAL_CAPSULE | ORAL | 0 refills | Status: DC

## 2020-02-09 ENCOUNTER — Other Ambulatory Visit: Payer: Self-pay | Admitting: Family Medicine

## 2020-02-09 DIAGNOSIS — F411 Generalized anxiety disorder: Secondary | ICD-10-CM

## 2020-03-20 ENCOUNTER — Ambulatory Visit: Payer: BC Managed Care – PPO | Admitting: Family Medicine

## 2020-05-02 ENCOUNTER — Other Ambulatory Visit: Payer: Self-pay | Admitting: Family Medicine

## 2020-05-02 DIAGNOSIS — Z1231 Encounter for screening mammogram for malignant neoplasm of breast: Secondary | ICD-10-CM

## 2020-05-12 ENCOUNTER — Other Ambulatory Visit: Payer: Self-pay | Admitting: Family Medicine

## 2020-05-12 DIAGNOSIS — F411 Generalized anxiety disorder: Secondary | ICD-10-CM

## 2020-05-12 NOTE — Telephone Encounter (Signed)
90 day courtesy RF- no further RF until seen by PCP Requested Prescriptions  Pending Prescriptions Disp Refills   citalopram (CELEXA) 20 MG tablet [Pharmacy Med Name: CITALOPRAM 20MG  TABLETS] 90 tablet 0    Sig: TAKE 1 TABLET(20 MG) BY MOUTH DAILY     Psychiatry:  Antidepressants - SSRI Failed - 05/12/2020 11:25 AM      Failed - Valid encounter within last 6 months    Recent Outpatient Visits          9 months ago Well adult exam   [facility] Steele Sizer, MD   1 year ago Well woman exam (no gynecological exam)   [facility] Steele Sizer, MD   2 years ago Generalized anxiety disorder   [facility] Steele Sizer, MD   2 years ago Generalized anxiety disorder   [place], [place]   2 years ago Generalized anxiety disorder   [place], MD      Future Appointments            In 3 weeks Ralene Bathe, MD [place]   In 3 months Steele Sizer, MD [facility], [facility]

## 2020-05-13 ENCOUNTER — Ambulatory Visit
Admission: RE | Admit: 2020-05-13 | Discharge: 2020-05-13 | Disposition: A | Discharge status: Home / Self Care | Payer: BC Managed Care – PPO | Source: Ambulatory Visit | Attending: Family Medicine | Admitting: Family Medicine

## 2020-05-13 DIAGNOSIS — Z1231 Encounter for screening mammogram for malignant neoplasm of breast: Secondary | ICD-10-CM | POA: Diagnosis not present

## 2020-06-03 ENCOUNTER — Ambulatory Visit: Payer: BC Managed Care – PPO | Admitting: Dermatology

## 2020-06-03 ENCOUNTER — Other Ambulatory Visit: Payer: Self-pay

## 2020-06-03 DIAGNOSIS — L578 Other skin changes due to chronic exposure to nonionizing radiation: Secondary | ICD-10-CM | POA: Diagnosis not present

## 2020-06-03 DIAGNOSIS — C44719 Basal cell carcinoma of skin of left lower limb, including hip: Secondary | ICD-10-CM

## 2020-06-03 DIAGNOSIS — D2372 Other benign neoplasm of skin of left lower limb, including hip: Secondary | ICD-10-CM | POA: Diagnosis not present

## 2020-06-03 DIAGNOSIS — C4491 Basal cell carcinoma of skin, unspecified: Secondary | ICD-10-CM

## 2020-06-03 DIAGNOSIS — D492 Neoplasm of unspecified behavior of bone, soft tissue, and skin: Secondary | ICD-10-CM

## 2020-06-03 HISTORY — DX: Basal cell carcinoma of skin, unspecified: C44.91

## 2020-06-03 NOTE — Patient Instructions (Signed)

## 2020-06-03 NOTE — Progress Notes (Signed)
   Follow-Up Visit   Subjective  Rachel Caldwell is a 49 y.o. female who presents for the following: Skin Problem (check a spot red spot on the L leg, pt report she hit this area shaving ).  The following portions of the chart were reviewed this encounter and updated as appropriate:  Tobacco  Allergies  Meds  Problems  Med Hx  Surg Hx  Fam Hx     Review of Systems:  No other skin or systemic complaints except as noted in HPI or Assessment and Plan.  Objective  Well appearing patient in no apparent distress; mood and affect are within normal limits.  A focused examination was performed including left leg . Relevant physical exam findings are noted in the Assessment and Plan.  Objective  Left mid to medial pretibial-medial: 0.6 cm irregular pink flat papule      Objective  Left mid to medial pretibial-lateral: 0.6 cm irregular pink flat papule         Assessment & Plan    Neoplasm of skin (2) Left mid to medial pretibial-medial  Skin / nail biopsy Type of biopsy: tangential   Informed consent: discussed and consent obtained   Patient was prepped and draped in usual sterile fashion: area prepped with alochol. Anesthesia: the lesion was anesthetized in a standard fashion   Anesthetic:  1% lidocaine w/ epinephrine 1-100,000 buffered w/ 8.4% NaHCO3 Instrument used: flexible razor blade   Hemostasis achieved with: pressure, aluminum chloride and electrodesiccation   Outcome: patient tolerated procedure well   Post-procedure details: wound care instructions given   Post-procedure details comment:  Ointment and small bandage  Specimen 1 - Surgical pathology Differential Diagnosis: R/O Dermatofibroma vs CA Check Margins: No 0.6 cm irregular pink flat papule  Left mid to medial pretibial-lateral  Skin / nail biopsy performed with local anesthesia after informed consent (as above)  Specimen 2 - Surgical pathology Differential Diagnosis: R/O Dermatofibroma vs CA    Check Margins: No 0.6 cm irregular pink flat papule  Actinic Damage - diffuse scaly erythematous macules with underlying dyspigmentation - Recommend daily broad spectrum sunscreen SPF 30+ to sun-exposed areas, reapply every 2 hours as needed.  - Call for new or changing lesions.  Return if symptoms worsen or fail to improve.   IMarye Round, CMA, am acting as scribe for Sarina Ser, MD . Documentation: I have reviewed the above documentation for accuracy and completeness, and I agree with the above.  Sarina Ser, MD

## 2020-06-04 ENCOUNTER — Encounter: Payer: Self-pay | Admitting: Dermatology

## 2020-06-06 ENCOUNTER — Telehealth: Payer: Self-pay

## 2020-06-06 NOTE — Telephone Encounter (Signed)
Patient informed of results and appointment scheduled. 

## 2020-06-06 NOTE — Telephone Encounter (Signed)
-----   Message from Ralene Bathe, MD sent at 06/06/2020  1:26 PM EDT ----- 1. Skin , left mid to medial pretibial medial SUPERFICIAL BASAL CELL CARCINOMA 2. Skin , left mid to medial pretibial lateral SURFACE OF A DERMATOFIBROMA  1- Cancer - BCC Superficial Schedule for treatment (EDC) 2- benign dermatofibroma

## 2020-07-23 ENCOUNTER — Other Ambulatory Visit: Payer: Self-pay

## 2020-07-23 ENCOUNTER — Ambulatory Visit: Payer: BC Managed Care – PPO | Admitting: Dermatology

## 2020-07-23 ENCOUNTER — Other Ambulatory Visit: Payer: Self-pay | Admitting: Dermatology

## 2020-07-23 DIAGNOSIS — D2272 Melanocytic nevi of left lower limb, including hip: Secondary | ICD-10-CM | POA: Diagnosis not present

## 2020-07-23 DIAGNOSIS — L821 Other seborrheic keratosis: Secondary | ICD-10-CM

## 2020-07-23 DIAGNOSIS — C44719 Basal cell carcinoma of skin of left lower limb, including hip: Secondary | ICD-10-CM | POA: Diagnosis not present

## 2020-07-23 DIAGNOSIS — D492 Neoplasm of unspecified behavior of bone, soft tissue, and skin: Secondary | ICD-10-CM

## 2020-07-23 DIAGNOSIS — C44711 Basal cell carcinoma of skin of unspecified lower limb, including hip: Secondary | ICD-10-CM

## 2020-07-23 DIAGNOSIS — C44712 Basal cell carcinoma of skin of right lower limb, including hip: Secondary | ICD-10-CM | POA: Diagnosis not present

## 2020-07-23 DIAGNOSIS — C4491 Basal cell carcinoma of skin, unspecified: Secondary | ICD-10-CM

## 2020-07-23 DIAGNOSIS — D239 Other benign neoplasm of skin, unspecified: Secondary | ICD-10-CM

## 2020-07-23 DIAGNOSIS — L578 Other skin changes due to chronic exposure to nonionizing radiation: Secondary | ICD-10-CM

## 2020-07-23 HISTORY — DX: Other benign neoplasm of skin, unspecified: D23.9

## 2020-07-23 NOTE — Patient Instructions (Signed)

## 2020-07-23 NOTE — Progress Notes (Signed)
Follow-Up Visit   Subjective  Rachel Caldwell is a 49 y.o. female who presents for the following: Skin Cancer (Biopsy proven Superficial BCC L mid to medial pretibial 06-03-2020 pt present for treatment today ). The patient has other spots she is concerned about would like them checked today.  The following portions of the chart were reviewed this encounter and updated as appropriate:  Tobacco  Allergies  Meds  Problems  Med Hx  Surg Hx  Fam Hx     Review of Systems:  No other skin or systemic complaints except as noted in HPI or Assessment and Plan.  Objective  Well appearing patient in no apparent distress; mood and affect are within normal limits.  A focused examination was performed including L leg. Relevant physical exam findings are noted in the Assessment and Plan.  Objective  Left Lower Leg - Anterior: Pink pearly papule or plaque with arborizing vessels.   Objective  Right post thigh: 1.4 cm red patch   Objective  L post thigh: 0.4 cm dark brown macule    Assessment & Plan  Superficial basal cell carcinoma - biopsy proven Left mid medial pretibial - medial  Destruction of lesion Complexity: extensive   Destruction method: electrodesiccation and curettage   Informed consent: discussed and consent obtained   Timeout:  patient name, date of birth, surgical site, and procedure verified Procedure prep:  Patient was prepped and draped in usual sterile fashion Prep type:  Isopropyl alcohol Anesthesia: the lesion was anesthetized in a standard fashion   Anesthetic:  1% lidocaine w/ epinephrine 1-100,000 buffered w/ 8.4% NaHCO3 Curettage performed in three different directions: Yes   Electrodesiccation performed over the curetted area: Yes   Lesion length (cm):  1.5 Lesion width (cm):  1.5 Margin per side (cm):  0.2 Final wound size (cm):  1.9 Hemostasis achieved with:  pressure, aluminum chloride and electrodesiccation Outcome: patient tolerated procedure  well with no complications   Post-procedure details: sterile dressing applied and wound care instructions given   Dressing type: bandage and petrolatum    Neoplasm of skin (2) Right post thigh  Epidermal / dermal shaving  Lesion diameter (cm):  1.4 Informed consent: discussed and consent obtained   Patient was prepped and draped in usual sterile fashion: area prepped with alcohol. Anesthesia: the lesion was anesthetized in a standard fashion   Anesthetic:  1% lidocaine w/ epinephrine 1-100,000 buffered w/ 8.4% NaHCO3 Instrument used: flexible razor blade   Hemostasis achieved with: pressure, aluminum chloride and electrodesiccation   Outcome: patient tolerated procedure well   Post-procedure details: wound care instructions given   Post-procedure details comment:  Ointment and small bandage applied  Destruction of lesion Complexity: extensive   Destruction method: electrodesiccation and curettage   Informed consent: discussed and consent obtained   Timeout:  patient name, date of birth, surgical site, and procedure verified Procedure prep:  Patient was prepped and draped in usual sterile fashion Prep type:  Isopropyl alcohol Anesthesia: the lesion was anesthetized in a standard fashion   Anesthetic:  1% lidocaine w/ epinephrine 1-100,000 buffered w/ 8.4% NaHCO3 Curettage performed in three different directions: Yes   Electrodesiccation performed over the curetted area: Yes   Lesion length (cm):  1.4 Lesion width (cm):  1.4 Margin per side (cm):  0.2 Final wound size (cm):  1.8 Hemostasis achieved with:  pressure, aluminum chloride and electrodesiccation Outcome: patient tolerated procedure well with no complications   Post-procedure details: sterile dressing applied and wound care  instructions given   Dressing type: bandage and petrolatum    Specimen 1 - Surgical pathology Differential Diagnosis: R/O BCC  Check Margins: No 1.4 cm red patch  L post thigh  Skin / nail  biopsy Type of biopsy: tangential   Informed consent: discussed and consent obtained   Patient was prepped and draped in usual sterile fashion: area prepped with alochol. Anesthesia: the lesion was anesthetized in a standard fashion   Anesthetic:  1% lidocaine w/ epinephrine 1-100,000 buffered w/ 8.4% NaHCO3 Instrument used: flexible razor blade   Hemostasis achieved with: pressure, aluminum chloride and electrodesiccation   Outcome: patient tolerated procedure well   Post-procedure details: wound care instructions given   Post-procedure details comment:  Ointment and small bandage  Specimen 2 - Surgical pathology Differential Diagnosis: Nevus R/O Dysplastic nevus  Check Margins: No 0.4 cm dark brown macule  Actinic Damage - diffuse scaly erythematous macules with underlying dyspigmentation - Recommend daily broad spectrum sunscreen SPF 30+ to sun-exposed areas, reapply every 2 hours as needed.  - Call for new or changing lesions.  Seborrheic Keratoses - Stuck-on, waxy, tan-brown papules and plaques  - Discussed benign etiology and prognosis. - Observe - Call for any changes  Return in about 3 months (around 10/22/2020) for TBSE.  IMarye Round, CMA, am acting as scribe for Sarina Ser, MD . Documentation: I have reviewed the above documentation for accuracy and completeness, and I agree with the above.  Sarina Ser, MD

## 2020-07-27 ENCOUNTER — Encounter: Payer: Self-pay | Admitting: Dermatology

## 2020-07-30 ENCOUNTER — Telehealth: Payer: Self-pay

## 2020-07-30 NOTE — Telephone Encounter (Signed)
LM on VM to return my call. 

## 2020-07-30 NOTE — Telephone Encounter (Signed)
-----   Message from Ralene Bathe, MD sent at 07/26/2020  6:25 PM EDT ----- 1. Skin , (A) right posterior thigh BASAL CELL CARCINOMA, SUPERFICIAL AND NODULAR PATTERNS 2. Skin , (B) left posterior thigh DYSPLASTIC COMPOUND NEVUS WITH MODERATE ATYPIA, LIMITED MARGINS FREE  1- Cancer - BCC Already treated Recheck next visit 2- Dysplastic Moderate Recheck next visit

## 2020-08-06 ENCOUNTER — Telehealth: Payer: Self-pay

## 2020-08-06 NOTE — Telephone Encounter (Signed)
-----   Message from Ralene Bathe, MD sent at 07/26/2020  6:25 PM EDT ----- 1. Skin , (A) right posterior thigh BASAL CELL CARCINOMA, SUPERFICIAL AND NODULAR PATTERNS 2. Skin , (B) left posterior thigh DYSPLASTIC COMPOUND NEVUS WITH MODERATE ATYPIA, LIMITED MARGINS FREE  1- Cancer - BCC Already treated Recheck next visit 2- Dysplastic Moderate Recheck next visit

## 2020-08-06 NOTE — Telephone Encounter (Signed)
Patient informed of pathology results via [place]. She did not have any follow up questions or concerns.

## 2020-08-08 ENCOUNTER — Other Ambulatory Visit: Payer: Self-pay | Admitting: Family Medicine

## 2020-08-08 DIAGNOSIS — F411 Generalized anxiety disorder: Secondary | ICD-10-CM

## 2020-08-08 NOTE — Telephone Encounter (Signed)
Courtesy refill until appt 08/16/20

## 2020-08-16 ENCOUNTER — Encounter: Payer: Self-pay | Admitting: Family Medicine

## 2020-09-12 DIAGNOSIS — T162XXA Foreign body in left ear, initial encounter: Secondary | ICD-10-CM | POA: Diagnosis not present

## 2020-12-09 ENCOUNTER — Encounter: Payer: BC Managed Care – PPO | Admitting: Dermatology

## 2020-12-20 ENCOUNTER — Encounter: Payer: Self-pay | Admitting: Family Medicine

## 2020-12-25 ENCOUNTER — Encounter: Payer: Self-pay | Admitting: Family Medicine

## 2020-12-25 ENCOUNTER — Ambulatory Visit (INDEPENDENT_AMBULATORY_CARE_PROVIDER_SITE_OTHER): Payer: BC Managed Care – PPO | Admitting: Family Medicine

## 2020-12-25 ENCOUNTER — Other Ambulatory Visit: Payer: Self-pay

## 2020-12-25 VITALS — BP 120/80 | HR 95 | Temp 98.0°F | Resp 16 | Ht 67.0 in | Wt 138.2 lb

## 2020-12-25 DIAGNOSIS — Z114 Encounter for screening for human immunodeficiency virus [HIV]: Secondary | ICD-10-CM

## 2020-12-25 DIAGNOSIS — R61 Generalized hyperhidrosis: Secondary | ICD-10-CM

## 2020-12-25 DIAGNOSIS — Z8249 Family history of ischemic heart disease and other diseases of the circulatory system: Secondary | ICD-10-CM

## 2020-12-25 DIAGNOSIS — Z13220 Encounter for screening for lipoid disorders: Secondary | ICD-10-CM | POA: Diagnosis not present

## 2020-12-25 DIAGNOSIS — Z131 Encounter for screening for diabetes mellitus: Secondary | ICD-10-CM | POA: Diagnosis not present

## 2020-12-25 DIAGNOSIS — Z90710 Acquired absence of both cervix and uterus: Secondary | ICD-10-CM | POA: Diagnosis not present

## 2020-12-25 DIAGNOSIS — Z0000 Encounter for general adult medical examination without abnormal findings: Secondary | ICD-10-CM

## 2020-12-25 DIAGNOSIS — J4520 Mild intermittent asthma, uncomplicated: Secondary | ICD-10-CM

## 2020-12-25 DIAGNOSIS — Z130 Encounter for screening for diseases of the blood and blood-forming organs and certain disorders involving the immune mechanism: Secondary | ICD-10-CM

## 2020-12-25 DIAGNOSIS — Z23 Encounter for immunization: Secondary | ICD-10-CM | POA: Diagnosis not present

## 2020-12-25 DIAGNOSIS — Z1211 Encounter for screening for malignant neoplasm of colon: Secondary | ICD-10-CM

## 2020-12-25 DIAGNOSIS — Z85828 Personal history of other malignant neoplasm of skin: Secondary | ICD-10-CM

## 2020-12-25 DIAGNOSIS — F411 Generalized anxiety disorder: Secondary | ICD-10-CM | POA: Diagnosis not present

## 2020-12-25 DIAGNOSIS — N390 Urinary tract infection, site not specified: Secondary | ICD-10-CM

## 2020-12-25 DIAGNOSIS — Z1231 Encounter for screening mammogram for malignant neoplasm of breast: Secondary | ICD-10-CM

## 2020-12-25 MED ORDER — CLONIDINE HCL 0.1 MG PO TABS: 0.1000 mg | ORAL_TABLET | Freq: Every evening | ORAL | 3 refills | Status: DC

## 2020-12-25 MED ORDER — BUSPIRONE HCL 5 MG PO TABS
5.0000 mg | ORAL_TABLET | Freq: Three times a day (TID) | ORAL | 0 refills | Status: DC
Start: 2020-12-25 — End: 2021-01-06

## 2020-12-25 MED ORDER — ALBUTEROL SULFATE HFA 108 (90 BASE) MCG/ACT IN AERS: 2.0000 | INHALATION_SPRAY | Freq: Four times a day (QID) | RESPIRATORY_TRACT | 1 refills | Status: DC | PRN

## 2020-12-25 MED ORDER — ESTRADIOL 0.5 MG PO TABS: 0.5000 mg | ORAL_TABLET | Freq: Every day | ORAL | 3 refills | Status: DC

## 2020-12-25 MED ORDER — NITROFURANTOIN MONOHYD MACRO 100 MG PO CAPS: ORAL_CAPSULE | ORAL | 2 refills | Status: DC

## 2020-12-25 NOTE — Progress Notes (Signed)
Name: Rachel Caldwell   MRN: [id number]    DOB: 29-Feb-1971   Date:12/25/2020       Progress Note  Subjective  Chief Complaint  Annual Exam  HPI  Patient presents for annual CPE and follow up. She is aware may have additional cost  Anxiety: she states difficulty shutting down her brain, she has been taking citalopram prn only, we will switch to buspar.   Vasomotor symptoms: she has a hysterectomy for benign causes in her 30's over the past 5 years she has hot flashes and night sweats, getting worse and is keeping her up at night, wakes up drenched in sweat, discussed medications and risk and benefits, including WHI study, she would like to try estrogen replacement and clonidine at night  Recurrent UTI's : she was seeing Dr. Bernardo Heater and since only had two episodes last year she will get refills of medications from me. She also takes it prn during intercourse.   Asthma mild intermittent: she states episodes are mild and intermittent, usually a dry cough and wheezing, needs ventolin   Basal cancer: seen by Dr. Nehemiah Massed, she has a follow up appointment for full body exam in April   Diet: she has a balanced diet  Exercise: discussed importance of increase in physical activity   [place] Office Visit from 12/25/2020 in [facility]  AUDIT-C Score 1     Depression: Phq 9 is  negative Depression screen [facility] 2/9 12/25/2020 08/15/2019 08/10/2018 02/07/2018 11/03/2017  Decreased Interest 0 0 0 0 0  Down, Depressed, Hopeless 0 0 0 0 0  PHQ - 2 Score 0 0 0 0 0  Altered sleeping - 0 0 1 -  Tired, decreased energy - 0 0 0 -  Change in appetite - 0 0 0 -  Feeling bad or failure about yourself  - 0 0 0 -  Trouble concentrating - 0 0 1 -  Moving slowly or fidgety/restless - 0 0 0 -  Suicidal thoughts - 0 0 0 -  PHQ-9 Score - 0 0 2 -  Difficult doing work/chores - Not difficult at all - - -   Hypertension: BP Readings from Last 3 Encounters:  12/25/20 120/80  12/28/19 132/86   08/15/19 108/62   Obesity: Wt Readings from Last 3 Encounters:  12/25/20 138 lb 3.2 oz (62.7 kg)  12/28/19 140 lb (63.5 kg)  08/15/19 137 lb 1.6 oz (62.2 kg)   BMI Readings from Last 3 Encounters:  12/25/20 21.65 kg/m  12/28/19 21.93 kg/m  08/15/19 22.13 kg/m     Vaccines:   Pneumonia: educated and discussed with patient. Flu: 11/08/2020 educated and discussed with patient.  Hep C Screening: 08/10/18 STD testing and prevention (HIV/chl/gon/syphilis): N/A Intimate partner violence: negative Sexual History : no pain  Menstrual History/LMP/Abnormal Bleeding: s/p hysterectomy  Incontinence Symptoms: no problems   Breast cancer:  - Last Mammogram: 04/2020 - BRCA gene screening: N/A  Osteoporosis: Discussed high calcium and vitamin D supplementation, weight bearing exercises  Cervical cancer screening: N/A  Skin cancer: seeing Dr. Nehemiah Massed  Colorectal cancer: Order placed today     ECG: N/A  Advanced Care Planning: A voluntary discussion about advance care planning including the explanation and discussion of advance directives.  Discussed health care proxy and Living will, and the patient was able to identify a health care proxy as husband .  Lipids: Lab Results  Component Value Date   CHOL 141 05/06/2017   CHOL 175 05/04/2016   Lab  Results  Component Value Date   HDL 58 05/06/2017   HDL 60 05/04/2016   Lab Results  Component Value Date   LDLCALC 69 05/06/2017   LDLCALC 85 05/04/2016   Lab Results  Component Value Date   TRIG 68 05/06/2017   TRIG 152 (H) 05/04/2016   Lab Results  Component Value Date   CHOLHDL 2.4 05/06/2017   CHOLHDL 2.9 05/04/2016   No results found for: LDLDIRECT  Glucose: Glucose  Date Value Ref Range Status  05/04/2016 81 65 - 99 mg/dL Final   Glucose, Bld  Date Value Ref Range Status  02/28/2018 108 (H) 65 - 99 mg/dL Final  05/06/2017 89 65 - 99 mg/dL Final  12/02/2006 85 70 - 99 mg/dL Final    Patient Active Problem  List   Diagnosis Date Noted  . H/O total hysterectomy 12/25/2020  . History of basal cell cancer 12/25/2020  . Mild intermittent asthma 05/11/2014  . Generalized anxiety disorder 12/01/2007  . 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    Past Surgical History:  Procedure Laterality Date  . ABDOMINAL HYSTERECTOMY  2005    Family History  Problem Relation Age of Onset  . Hypercholesterolemia Mother   . Heart block Mother        Put a Stent in  . Hypertension Father   . Breast cancer Neg Hx     Social History   Socioeconomic History  . Marital status: Married    Spouse name: Broadus John   . Number of children: 0  . Years of education: Not on file  . Highest education level: 12th grade  Occupational History  . Occupation: fund transfer   Tobacco Use  . Smoking status: Former Smoker    Packs/day: 0.50    Years: 12.00    Pack years: 6.00    Types: Cigarettes    Start date: 11/24/1999    Quit date: 11/03/2012    Years since quitting: 8.1  . Smokeless tobacco: Never Used  Vaping Use  . Vaping Use: Never used  Substance and Sexual Activity  . Alcohol use: No    Alcohol/week: 0.0 standard drinks  . Drug use: No  . Sexual activity: Yes    Partners: Male    Birth control/protection: Surgical  Other Topics Concern  . Not on file  Social History Narrative   Married, never had children secondary to gyn problems and two miscarriages, has one dog    Social Determinants of Radio broadcast assistant Strain: Low Risk   . Difficulty of Paying Living Expenses: Not hard at all  Food Insecurity: No Food Insecurity  . Worried About Charity fundraiser in the Last Year: Never true  . Ran Out of Food in the Last Year: Never true  Transportation Needs: No Transportation Needs  . Lack of Transportation (Medical): No  . Lack of Transportation (Non-Medical): No  Physical Activity: Insufficiently Active  . Days of Exercise per Week: 2 days  . Minutes of Exercise per Session: 20 min  Stress: No Stress  Concern Present  . Feeling of Stress : Only a little  Social Connections: Moderately Integrated  . Frequency of Communication with Friends and Family: More than three times a week  . Frequency of Social Gatherings with Friends and Family: Once a week  . Attends Religious Services: 1 to 4 times per year  . Active Member of Clubs or Organizations: No  . Attends Archivist Meetings: Never  . Marital Status: Married  Intimate  Partner Violence: Not At Risk  . Fear of Current or Ex-Partner: No  . Emotionally Abused: No  . Physically Abused: No  . Sexually Abused: No     Current Outpatient Medications:  .  albuterol (VENTOLIN HFA) 108 (90 Base) MCG/ACT inhaler, Inhale 2 puffs into the lungs every 6 (six) hours as needed for wheezing or shortness of breath., Disp: 18 g, Rfl: 1 .  busPIRone (BUSPAR) 5 MG tablet, Take 1 tablet (5 mg total) by mouth 3 (three) times daily., Disp: 90 tablet, Rfl: 0 .  cloNIDine (CATAPRES) 0.1 MG tablet, Take 1 tablet (0.1 mg total) by mouth at bedtime., Disp: 90 tablet, Rfl: 3 .  doxycycline (ORACEA) 40 MG capsule, TAKE ONE CAPSULE BY MOUTH EVERY DAY WITH FOOD AND WATER, Disp: , Rfl:  .  EPIDUO FORTE 0.3-2.5 % GEL, APPLY thin layer TO FACE AT bedtime; Pleak off IN THE MORNING], Disp: , Rfl:  .  estradiol (ESTRACE) 0.5 MG tablet, Take 1 tablet (0.5 mg total) by mouth daily., Disp: 90 tablet, Rfl: 3 .  nitrofurantoin, macrocrystal-monohydrate, (MACROBID) 100 MG capsule, 1 capsule twice daily for 5 days at first onset of UTI symptoms and prn intercourse, Disp: 30 capsule, Rfl: 2  No Known Allergies   ROS  Ten systems reviewed and is negative except as mentioned in HPI   Objective  Vitals:   12/25/20 1346  BP: 120/80  Pulse: 95  Resp: 16  Temp: 98 F (36.7 C)  TempSrc: Oral  SpO2: 98%  Weight: 138 lb 3.2 oz (62.7 kg)  Height: _0  (1.702 m)    Body mass index is 21.65 kg/m.  Physical Exam  Constitutional: Patient appears well-developed  and well-nourished. No distress.  HENT: Head: Normocephalic and atraumatic. Ears: B TMs ok, no erythema or effusion; Nose: Not done  Mouth/Throat: not done   Eyes: Conjunctivae and EOM are normal. Pupils are equal, round, and reactive to light. No scleral icterus.  Neck: Normal range of motion. Neck supple. No JVD present. No thyromegaly present.  Cardiovascular: Normal rate, regular rhythm and normal heart sounds.  No murmur heard. No BLE edema. Pulmonary/Chest: Effort normal and breath sounds normal. No respiratory distress. Abdominal: Soft. Bowel sounds are normal, no distension. There is no tenderness. no masses Breast: no lumps or masses, no nipple discharge or rashes FEMALE GENITALIA:  Not done  RECTAL: not pain  Musculoskeletal: Normal range of motion, no joint effusions. No gross deformities Neurological: he is alert and oriented to person, place, and time. No cranial nerve deficit. Coordination, balance, strength, speech and gait are normal.  Skin: no atypical lesions  Psychiatric: Patient has a normal mood and affect. behavior is normal. Judgment and thought content normal.  Fall Risk: Fall Risk  12/25/2020 08/15/2019 02/07/2018 11/03/2017 08/05/2017  Falls in the past year? 0 0 No No No  Number falls in past yr: 0 0 - - -  Injury with Fall? 0 0 - - -     Functional Status Survey: Is the patient deaf or have difficulty hearing?: No Does the patient have difficulty seeing, even when wearing glasses/contacts?: No Does the patient have difficulty concentrating, remembering, or making decisions?: No Does the patient have difficulty walking or climbing stairs?: No Does the patient have difficulty dressing or bathing?: No Does the patient have difficulty doing errands alone such as visiting a doctor's office or shopping?: No   Assessment & Plan  1. Well adult exam  - Lipid panel - CBC with Differential/Platelet -  COMPLETE METABOLIC PANEL WITH GFR - Hemoglobin A1c  2. History  of hysterectomy   3. History of basal cell cancer   4. Colon cancer screening  - Ambulatory referral to Gastroenterology  5. Breast cancer screening by mammogram  - MM 3D SCREEN BREAST BILATERAL; Future  6. Night sweats  - estradiol (ESTRACE) 0.5 MG tablet; Take 1 tablet (0.5 mg total) by mouth daily.  Dispense: 90 tablet; Refill: 3 - cloNIDine (CATAPRES) 0.1 MG tablet; Take 1 tablet (0.1 mg total) by mouth at bedtime.  Dispense: 90 tablet; Refill: 3 - FSH/LH  7. Recurrent UTI  - nitrofurantoin, macrocrystal-monohydrate, (MACROBID) 100 MG capsule; 1 capsule twice daily for 5 days at first onset of UTI symptoms and prn intercourse  Dispense: 30 capsule; Refill: 2  8. Generalized anxiety disorder  - busPIRone (BUSPAR) 5 MG tablet; Take 1 tablet (5 mg total) by mouth 3 (three) times daily.  Dispense: 90 tablet; Refill: 0  9. Encounter for screening for HIV  - HIV Antibody (routine testing w rflx)  10. Need for vaccination for pneumococcus  - Pneumococcal polysaccharide vaccine 23-valent greater than or equal to 2yo subcutaneous/IM  11. Mild intermittent asthma without complication  - albuterol (VENTOLIN HFA) 108 (90 Base) MCG/ACT inhaler; Inhale 2 puffs into the lungs every 6 (six) hours as needed for wheezing or shortness of breath.  Dispense: 18 g; Refill: 1  -USPSTF grade A and B recommendations reviewed with patient; age-appropriate recommendations, preventive care, screening tests, etc discussed and encouraged; healthy living encouraged; see AVS for patient education given to patient -Discussed importance of 150 minutes of physical activity weekly, eat two servings of fish weekly, eat one serving of tree nuts ( cashews, pistachios, pecans, almonds.Marland Kitchen) every other day, eat 6 servings of fruit/vegetables daily and drink plenty of water and avoid sweet beverages.

## 2020-12-25 NOTE — Patient Instructions (Signed)
Preventive Care 49-49 Years Old, Female Preventive care refers to lifestyle choices and visits with your health care provider that can promote health and wellness. This includes:  A yearly physical exam. This is also called an annual wellness visit.  Regular dental and eye exams.  Immunizations.  Screening for certain conditions.  Healthy lifestyle choices, such as: ? Eating a healthy diet. ? Getting regular exercise. ? Not using drugs or products that contain nicotine and tobacco. ? Limiting alcohol use. What can I expect for my preventive care visit? Physical exam Your health care provider will check your:  Height and weight. These may be used to calculate your BMI (body mass index). BMI is a measurement that tells if you are at a healthy weight.  Heart rate and blood pressure.  Body temperature.  Skin for abnormal spots. Counseling Your health care provider may ask you questions about your:  Past medical problems.  Family's medical history.  Alcohol, tobacco, and drug use.  Emotional well-being.  Home life and relationship well-being.  Sexual activity.  Diet, exercise, and sleep habits.  Work and work Statistician.  Access to firearms.  Method of birth control.  Menstrual cycle.  Pregnancy history. What immunizations do I need? Vaccines are usually given at various ages, according to a schedule. Your health care provider will recommend vaccines for you based on your age, medical history, and lifestyle or other factors, such as travel or where you work.   What tests do I need? Blood tests  Lipid and cholesterol levels. These may be checked every 5 years, or more often if you are over 3 years old.  Hepatitis C test.  Hepatitis B test. Screening  Lung cancer screening. You may have this screening every year starting at age 49 if you have a 30-pack-year history of smoking and currently smoke or have quit within the past 15 years.  Colorectal cancer  screening. ? All adults should have this screening starting at age 49 and continuing until age 49. ? Your health care provider may recommend screening at age 49 if you are at increased risk. ? You will have tests every 1-10 years, depending on your results and the type of screening test.  Diabetes screening. ? This is done by checking your blood sugar (glucose) after you have not eaten for a while (fasting). ? You may have this done every 1-3 years.  Mammogram. ? This may be done every 1-2 years. ? Talk with your health care provider about when you should start having regular mammograms. This may depend on whether you have a family history of breast cancer.  BRCA-related cancer screening. This may be done if you have a family history of breast, ovarian, tubal, or peritoneal cancers.  Pelvic exam and Pap test. ? This may be done every 3 years starting at age 49. ? Starting at age 11, this may be done every 5 years if you have a Pap test in combination with an HPV test. Other tests  STD (sexually transmitted disease) testing, if you are at risk.  Bone density scan. This is done to screen for osteoporosis. You may have this scan if you are at high risk for osteoporosis. Talk with your health care provider about your test results, treatment options, and if necessary, the need for more tests. Follow these instructions at home: Eating and drinking  Eat a diet that includes fresh fruits and vegetables, whole grains, lean protein, and low-fat dairy products.  Take vitamin and mineral supplements  as recommended by your health care provider.  Do not drink alcohol if: ? Your health care provider tells you not to drink. ? You are pregnant, may be pregnant, or are planning to become pregnant.  If you drink alcohol: ? Limit how much you have to 0-1 drink a day. ? Be aware of how much alcohol is in your drink. In the [place] one drink equals one 12 oz bottle of beer (355 mL), one 5 oz glass of  wine (148 mL), or one 1 oz glass of hard liquor (44 mL).   Lifestyle  Take daily care of your teeth and gums. Brush your teeth every morning and night with fluoride toothpaste. Floss one time each day.  Stay active. Exercise for at least 30 minutes 5 or more days each week.  Do not use any products that contain nicotine or tobacco, such as cigarettes, e-cigarettes, and chewing tobacco. If you need help quitting, ask your health care provider.  Do not use drugs.  If you are sexually active, practice safe sex. Use a condom or other form of protection to prevent STIs (sexually transmitted infections).  If you do not wish to become pregnant, use a form of birth control. If you plan to become pregnant, see your health care provider for a prepregnancy visit.  If told by your health care provider, take low-dose aspirin daily starting at age 50.  Find healthy ways to cope with stress, such as: ? Meditation, yoga, or listening to music. ? Journaling. ? Talking to a trusted person. ? Spending time with friends and family. Safety  Always wear your seat belt while driving or riding in a vehicle.  Do not drive: ? If you have been drinking alcohol. Do not ride with someone who has been drinking. ? When you are tired or distracted. ? While texting.  Wear a helmet and other protective equipment during sports activities.  If you have firearms in your house, make sure you follow all gun safety procedures. What's next?  Visit your health care provider once a year for an annual wellness visit.  Ask your health care provider how often you should have your eyes and teeth checked.  Stay up to date on all vaccines. This information is not intended to replace advice given to you by your health care provider. Make sure you discuss any questions you have with your health care provider. Document Revised: 08/13/2020 Document Reviewed: 07/21/2018 Elsevier Patient Education  2021 Elsevier Inc.  

## 2020-12-27 LAB — CBC WITH DIFFERENTIAL/PLATELET
Absolute Monocytes: 448 cells/uL (ref 200–950)
Basophils Absolute: 33 cells/uL (ref 0–200)
Basophils Relative: 0.4 %
Eosinophils Absolute: 149 cells/uL (ref 15–500)
Eosinophils Relative: 1.8 %
HCT: 41.2 % (ref 35.0–45.0)
Hemoglobin: 13.9 g/dL (ref 11.7–15.5)
Lymphs Abs: 2913 cells/uL (ref 850–3900)
MCH: 32 pg (ref 27.0–33.0)
MCHC: 33.7 g/dL (ref 32.0–36.0)
MCV: 94.9 fL (ref 80.0–100.0)
MPV: 11.4 fL (ref 7.5–12.5)
Monocytes Relative: 5.4 %
Neutro Abs: 4756 cells/uL (ref 1500–7800)
Neutrophils Relative %: 57.3 %
Platelets: 263 10*3/uL (ref 140–400)
RBC: 4.34 10*6/uL (ref 3.80–5.10)
RDW: 12.4 % (ref 11.0–15.0)
Total Lymphocyte: 35.1 %
WBC: 8.3 10*3/uL (ref 3.8–10.8)

## 2020-12-27 LAB — HEMOGLOBIN A1C
Hgb A1c MFr Bld: 5.6 % of total Hgb (ref <5.7)
Mean Plasma Glucose: 114 mg/dL
eAG (mmol/L): 6.3 mmol/L

## 2020-12-27 LAB — COMPLETE METABOLIC PANEL WITH GFR
AG Ratio: 1.8 (calc) (ref 1.0–2.5)
ALT: 10 U/L (ref 6–29)
AST: 15 U/L (ref 10–35)
Albumin: 4.2 g/dL (ref 3.6–5.1)
Alkaline phosphatase (APISO): 66 U/L (ref 31–125)
BUN/Creatinine Ratio: 8 (calc) (ref 6–22)
BUN: 6 mg/dL — ABNORMAL LOW (ref 7–25)
CO2: 25 mmol/L (ref 20–32)
Calcium: 9.7 mg/dL (ref 8.6–10.2)
Chloride: 103 mmol/L (ref 98–110)
Creat: 0.77 mg/dL (ref 0.50–1.10)
GFR, Est African American: 106 mL/min/{1.73_m2} (ref >=60)
GFR, Est Non African American: 91 mL/min/{1.73_m2} (ref >=60)
Globulin: 2.4 g/dL (calc) (ref 1.9–3.7)
Glucose, Bld: 86 mg/dL (ref 65–99)
Potassium: 4.8 mmol/L (ref 3.5–5.3)
Sodium: 138 mmol/L (ref 135–146)
Total Bilirubin: 0.4 mg/dL (ref 0.2–1.2)
Total Protein: 6.6 g/dL (ref 6.1–8.1)

## 2020-12-27 LAB — TEST AUTHORIZATION: TEST CODE:: 16802

## 2020-12-27 LAB — LIPID PANEL
Cholesterol: 189 mg/dL (ref <200)
HDL: 65 mg/dL (ref >=50)
LDL Cholesterol (Calc): 103 mg/dL (calc) — ABNORMAL HIGH
Non-HDL Cholesterol (Calc): 124 mg/dL (calc) (ref <130)
Total CHOL/HDL Ratio: 2.9 (calc) (ref <5.0)
Triglycerides: 114 mg/dL (ref <150)

## 2020-12-27 LAB — TSH: TSH: 3.25 mIU/L

## 2020-12-27 LAB — HIV ANTIBODY (ROUTINE TESTING W REFLEX): HIV 1&2 Ab, 4th Generation: NONREACTIVE

## 2021-01-05 ENCOUNTER — Encounter: Payer: Self-pay | Admitting: Family Medicine

## 2021-01-06 ENCOUNTER — Other Ambulatory Visit: Payer: Self-pay | Admitting: Family Medicine

## 2021-01-06 MED ORDER — HYDROXYZINE HCL 10 MG PO TABS: 10.0000 mg | ORAL_TABLET | Freq: Every evening | ORAL | 0 refills | Status: DC | PRN

## 2021-01-09 ENCOUNTER — Other Ambulatory Visit: Payer: Self-pay

## 2021-01-09 ENCOUNTER — Telehealth (INDEPENDENT_AMBULATORY_CARE_PROVIDER_SITE_OTHER): Payer: Self-pay | Admitting: Gastroenterology

## 2021-01-09 DIAGNOSIS — Z1211 Encounter for screening for malignant neoplasm of colon: Secondary | ICD-10-CM

## 2021-01-09 MED ORDER — NA SULFATE-K SULFATE-MG SULF 17.5-3.13-1.6 GM/177ML PO SOLN: 1.0000 | Freq: Once | ORAL | 0 refills | Status: AC

## 2021-01-09 NOTE — Progress Notes (Signed)
Gastroenterology Pre-Procedure Review  Request Date: Friday 02/07/21 Requesting Physician: Dr. Bonna Gains  PATIENT REVIEW QUESTIONS: The patient responded to the following health history questions as indicated:    1. Are you having any GI issues? no 2. Do you have a personal history of Polyps? no 3. Do you have a family history of Colon Cancer or Polyps? no 4. Diabetes Mellitus? no 5. Joint replacements in the past 12 months?no 6. Major health problems in the past 3 months?no 7. Any artificial heart valves, MVP, or defibrillator?no    MEDICATIONS & ALLERGIES:    Patient reports the following regarding taking any anticoagulation/antiplatelet therapy:   Plavix, Coumadin, Eliquis, Xarelto, Lovenox, Pradaxa, Brilinta, or Effient? no Aspirin? no  Patient confirms/reports the following medications:  Current Outpatient Medications  Medication Sig Dispense Refill  . albuterol (VENTOLIN HFA) 108 (90 Base) MCG/ACT inhaler Inhale 2 puffs into the lungs every 6 (six) hours as needed for wheezing or shortness of breath. 18 g 1  . cloNIDine (CATAPRES) 0.1 MG tablet Take 1 tablet (0.1 mg total) by mouth at bedtime. 90 tablet 3  . doxycycline (ORACEA) 40 MG capsule TAKE ONE CAPSULE BY MOUTH EVERY DAY WITH FOOD AND WATER    . EPIDUO FORTE 0.3-2.5 % GEL APPLY thin layer TO FACE AT bedtime; Moore off IN THE MORNING]    . estradiol (ESTRACE) 0.5 MG tablet Take 1 tablet (0.5 mg total) by mouth daily. 90 tablet 3  . hydrOXYzine (ATARAX/VISTARIL) 10 MG tablet Take 1-2 tablets (10-20 mg total) by mouth at bedtime as needed. 30 tablet 0  . Na Sulfate-K Sulfate-Mg Sulf 17.5-3.13-1.6 GM/177ML SOLN Take 1 kit by mouth once for 1 dose. 354 mL 0  . nitrofurantoin, macrocrystal-monohydrate, (MACROBID) 100 MG capsule 1 capsule twice daily for 5 days at first onset of UTI symptoms and prn intercourse 30 capsule 2   No current facility-administered medications for this visit.    Patient confirms/reports the following  allergies:  No Known Allergies  No orders of the defined types were placed in this encounter.   AUTHORIZATION INFORMATION Primary Insurance: 1D#: Group #:  Secondary Insurance: 1D#: Group #:  SCHEDULE INFORMATION: Date: 02/07/21 Time: Location:[facility]

## 2021-01-13 ENCOUNTER — Encounter: Payer: Self-pay | Admitting: Family Medicine

## 2021-01-20 ENCOUNTER — Other Ambulatory Visit: Payer: Self-pay | Admitting: Family Medicine

## 2021-01-20 ENCOUNTER — Other Ambulatory Visit: Payer: Self-pay

## 2021-01-20 MED ORDER — CITALOPRAM HYDROBROMIDE 20 MG PO TABS: 20.0000 mg | ORAL_TABLET | Freq: Every day | ORAL | 1 refills | Status: DC

## 2021-02-04 ENCOUNTER — Telehealth: Payer: Self-pay | Admitting: Gastroenterology

## 2021-02-04 NOTE — Telephone Encounter (Signed)
Patient called and stated she had spoken to you through [place].  She needs to know if she can have her Covid test at the [facility] site because it is closer to her work.  Please call to advise

## 2021-02-04 NOTE — Telephone Encounter (Signed)
Returned pt's call regarding covid testing site location. Pt has requested going to the [place] location due to work schedule.

## 2021-02-05 ENCOUNTER — Other Ambulatory Visit: Admission: RE | Admit: 2021-02-05 | Payer: BC Managed Care – PPO | Source: Ambulatory Visit

## 2021-02-05 ENCOUNTER — Other Ambulatory Visit (HOSPITAL_COMMUNITY)
Admission: RE | Admit: 2021-02-05 | Discharge: 2021-02-05 | Disposition: A | Discharge status: Home / Self Care | Payer: BC Managed Care – PPO | Source: Ambulatory Visit | Attending: Gastroenterology | Admitting: Gastroenterology

## 2021-02-05 DIAGNOSIS — Z20822 Contact with and (suspected) exposure to covid-19: Secondary | ICD-10-CM | POA: Diagnosis not present

## 2021-02-05 DIAGNOSIS — Z01812 Encounter for preprocedural laboratory examination: Secondary | ICD-10-CM | POA: Diagnosis not present

## 2021-02-05 LAB — SARS CORONAVIRUS 2 (TAT 6-24 HRS): SARS Coronavirus 2: NEGATIVE

## 2021-02-07 ENCOUNTER — Ambulatory Visit: Payer: BC Managed Care – PPO | Admitting: Certified Registered"

## 2021-02-07 ENCOUNTER — Encounter: Payer: Self-pay | Admitting: Gastroenterology

## 2021-02-07 ENCOUNTER — Encounter
Admission: RE | Disposition: A | Discharge status: Home / Self Care | Payer: Self-pay | Source: Home / Self Care | Attending: Gastroenterology

## 2021-02-07 ENCOUNTER — Ambulatory Visit
Admission: RE | Admit: 2021-02-07 | Discharge: 2021-02-07 | Disposition: A | Discharge status: Home / Self Care | Payer: BC Managed Care – PPO | Attending: Gastroenterology | Admitting: Gastroenterology

## 2021-02-07 DIAGNOSIS — Z8349 Family history of other endocrine, nutritional and metabolic diseases: Secondary | ICD-10-CM | POA: Insufficient documentation

## 2021-02-07 DIAGNOSIS — Z1211 Encounter for screening for malignant neoplasm of colon: Secondary | ICD-10-CM | POA: Diagnosis not present

## 2021-02-07 DIAGNOSIS — Z79818 Long term (current) use of other agents affecting estrogen receptors and estrogen levels: Secondary | ICD-10-CM | POA: Insufficient documentation

## 2021-02-07 DIAGNOSIS — K635 Polyp of colon: Secondary | ICD-10-CM | POA: Diagnosis not present

## 2021-02-07 DIAGNOSIS — Z85828 Personal history of other malignant neoplasm of skin: Secondary | ICD-10-CM | POA: Diagnosis not present

## 2021-02-07 DIAGNOSIS — D123 Benign neoplasm of transverse colon: Secondary | ICD-10-CM | POA: Insufficient documentation

## 2021-02-07 DIAGNOSIS — Z8249 Family history of ischemic heart disease and other diseases of the circulatory system: Secondary | ICD-10-CM | POA: Diagnosis not present

## 2021-02-07 DIAGNOSIS — D122 Benign neoplasm of ascending colon: Secondary | ICD-10-CM | POA: Insufficient documentation

## 2021-02-07 DIAGNOSIS — Z79899 Other long term (current) drug therapy: Secondary | ICD-10-CM | POA: Diagnosis not present

## 2021-02-07 DIAGNOSIS — Z87891 Personal history of nicotine dependence: Secondary | ICD-10-CM | POA: Diagnosis not present

## 2021-02-07 HISTORY — PX: COLONOSCOPY WITH PROPOFOL: SHX5780

## 2021-02-07 SURGERY — COLONOSCOPY WITH PROPOFOL
Anesthesia: General

## 2021-02-07 MED ORDER — PROPOFOL 10 MG/ML IV BOLUS
INTRAVENOUS | Status: DC | PRN
  Administered 2021-02-07: 70 mg via INTRAVENOUS
  Administered 2021-02-07: 10 mg via INTRAVENOUS

## 2021-02-07 MED ORDER — MIDAZOLAM HCL 5 MG/5ML IJ SOLN
INTRAMUSCULAR | Status: DC | PRN
  Administered 2021-02-07: 2 mg via INTRAVENOUS

## 2021-02-07 MED ORDER — PROPOFOL 500 MG/50ML IV EMUL
INTRAVENOUS | Status: DC | PRN
  Administered 2021-02-07: 120 ug/kg/min via INTRAVENOUS

## 2021-02-07 MED ORDER — MIDAZOLAM HCL 2 MG/2ML IJ SOLN
INTRAMUSCULAR | Status: AC
  Filled 2021-02-07: qty 2

## 2021-02-07 MED ORDER — SODIUM CHLORIDE 0.9 % IV SOLN: INTRAVENOUS | Status: DC

## 2021-02-07 MED ORDER — PROPOFOL 500 MG/50ML IV EMUL
INTRAVENOUS | Status: AC
  Filled 2021-02-07: qty 50

## 2021-02-07 NOTE — Op Note (Signed)
[facility] Gastroenterology Patient Name: Rachel Caldwell Procedure Date: 02/07/2021 10:55 AM MRN: [id number] Account #: [id number] Date of Birth: May 11, 1971 Admit Type: Outpatient Age: 50 Room: [facility] ENDO ROOM 2 Gender: Female Note Status: Finalized Procedure:             Colonoscopy Indications:           Screening for colorectal malignant neoplasm Providers:             Miela Desjardin B. Bonna Gains MD, MD Medicines:             Monitored Anesthesia Care Complications:         No immediate complications. Procedure:             Pre-Anesthesia Assessment:                        - ASA Grade Assessment: II - A patient with mild                         systemic disease.                        - Prior to the procedure, a History and Physical was                         performed, and patient medications, allergies and                         sensitivities were reviewed. The patient's tolerance                         of previous anesthesia was reviewed.                        - The risks and benefits of the procedure and the                         sedation options and risks were discussed with the                         patient. All questions were answered and informed                         consent was obtained.                        - Patient identification and proposed procedure were                         verified prior to the procedure by the physician, the                         nurse, the anesthesiologist, the anesthetist and the                         technician. The procedure was verified in the                         procedure room.  After obtaining informed consent, the colonoscope was                         passed under direct vision. Throughout the procedure,                         the patient's blood pressure, pulse, and oxygen                         saturations were monitored continuously. The                         Colonoscope  was introduced through the anus and                         advanced to the the cecum, identified by appendiceal                         orifice and ileocecal valve. The colonoscopy was                         performed with ease. The patient tolerated the                         procedure well. The quality of the bowel preparation                         was fair. Repeated clogging of the suction channel                         occured due to seeds/nuts. Findings:      The perianal and digital rectal examinations were normal.      Three flat and sessile polyps were found in the transverse colon and       ascending colon. The polyps were 8 to 10 mm in size. These polyps were       removed with a cold snare. Resection and retrieval were complete.      The exam was otherwise without abnormality.      The rectum, sigmoid colon, descending colon, transverse colon, ascending       colon and cecum appeared normal.      The retroflexed view of the distal rectum and anal verge was normal and       showed no anal or rectal abnormalities. Impression:            - Three 8 to 10 mm polyps in the transverse colon and                         in the ascending colon, removed with a cold snare.                         Resected and retrieved.                        - The examination was otherwise normal.                        - The rectum, sigmoid colon, descending colon,  transverse colon, ascending colon and cecum are normal.                        - The distal rectum and anal verge are normal on                         retroflexion view. Recommendation:        - Discharge patient to home (with escort).                        - Advance diet as tolerated.                        - Continue present medications.                        - Await pathology results.                        - Repeat colonoscopy in 1 year, with 2 day prep and                         low fiber diet 1 week  prior, because the bowel                         preparation was suboptimal.                        - The findings and recommendations were discussed with                         the patient.                        - The findings and recommendations were discussed with                         the patient's family.                        - Return to primary care physician as previously                         scheduled. Procedure Code(s):     --- Professional ---                        [id number], Colonoscopy, flexible; with removal of                         tumor(s), polyp(s), or other lesion(s) by snare                         technique Diagnosis Code(s):     --- Professional ---                        Z12.11, Encounter for screening for malignant neoplasm                         of colon  K63.5, Polyp of colon CPT copyright 2019 American Medical Association. All rights reserved. The codes documented in this report are preliminary and upon coder review may  be revised to meet current compliance requirements.  Vonda Antigua, MD Margretta Sidle B. Bonna Gains MD, MD 02/07/2021 11:50:36 AM This report has been signed electronically. Number of Addenda: 0 Note Initiated On: 02/07/2021 10:55 AM Scope Withdrawal Time: 0 hours 25 minutes 2 seconds  Total Procedure Duration: 0 hours 29 minutes 18 seconds  Estimated Blood Loss:  Estimated blood loss: none.      [facility]

## 2021-02-07 NOTE — Anesthesia Preprocedure Evaluation (Signed)
Anesthesia Evaluation  Patient identified by MRN, date of birth, ID band Patient awake    Reviewed: Allergy & Precautions, H&P , NPO status , Patient's Chart, lab work & pertinent test results  History of Anesthesia Complications Negative for: history of anesthetic complications  Airway Mallampati: II  TM Distance: >3 FB     Dental  (+) Teeth Intact   Pulmonary asthma , neg sleep apnea, neg COPD, former smoker,    breath sounds clear to auscultation       Cardiovascular (-) angina(-) Past MI and (-) Cardiac Stents negative cardio ROS  (-) dysrhythmias  Rhythm:regular Rate:Normal     Neuro/Psych PSYCHIATRIC DISORDERS Anxiety negative neurological ROS     GI/Hepatic negative GI ROS, Neg liver ROS,   Endo/Other  negative endocrine ROS  Renal/GU negative Renal ROS  negative genitourinary   Musculoskeletal   Abdominal   Peds  Hematology negative hematology ROS (+)   Anesthesia Other Findings Past Medical History: No date: Anxiety 06/03/2020: Basal cell carcinoma     Comment:  left mid to med pretibial medial 07/23/2020: Basal cell carcinoma     Comment:  right post thigh, superficial  07/23/2020: Dysplastic nevus     Comment:  left post thigh, mod atypia  No date: Mild intermittent asthma  Past Surgical History: 2005: ABDOMINAL HYSTERECTOMY  BMI    Body Mass Index: 21.94 kg/m      Reproductive/Obstetrics negative OB ROS                             Anesthesia Physical Anesthesia Plan  ASA: II  Anesthesia Plan: General   Post-op Pain Management:    Induction:   PONV Risk Score and Plan: Propofol infusion and TIVA  Airway Management Planned: Nasal Cannula  Additional Equipment:   Intra-op Plan:   Post-operative Plan:   Informed Consent: I have reviewed the patients History and Physical, chart, labs and discussed the procedure including the risks, benefits and  alternatives for the proposed anesthesia with the patient or authorized representative who has indicated his/her understanding and acceptance.     Dental Advisory Given  Plan Discussed with: Anesthesiologist, CRNA and Surgeon  Anesthesia Plan Comments:         Anesthesia Quick Evaluation

## 2021-02-07 NOTE — Transfer of Care (Signed)
Immediate Anesthesia Transfer of Care Note  Patient: Rachel Caldwell  Procedure(s) Performed: COLONOSCOPY WITH PROPOFOL (N/A )  Patient Location: Endoscopy Unit  Anesthesia Type:General  Level of Consciousness: awake  Airway & Oxygen Therapy: Patient Spontanous Breathing  Post-op Assessment: Report given to RN and Post -op Vital signs reviewed and stable  Post vital signs: Reviewed  Last Vitals:  Vitals Value Taken Time  BP    Temp    Pulse    Resp    SpO2      Last Pain:  Vitals:   02/07/21 1051  TempSrc: Skin  PainSc: 0-No pain         Complications: No complications documented.

## 2021-02-07 NOTE — H&P (Signed)
Vonda Antigua, MD [address] [place], [place], [place], [address] [address], [place], [place], [place], [address] Phone: [phone]  Fax: [phone]  Primary Care Physician:  Steele Sizer, MD   Pre-Procedure History & Physical: HPI:  Rachel Caldwell is a 50 y.o. female is here for a colonoscopy.   Past Medical History:  Diagnosis Date   Anxiety    Basal cell carcinoma 06/03/2020   left mid to med pretibial medial   Basal cell carcinoma 07/23/2020   right post thigh, superficial    Dysplastic nevus 07/23/2020   left post thigh, mod atypia    Mild intermittent asthma     Past Surgical History:  Procedure Laterality Date   ABDOMINAL HYSTERECTOMY  2005    Prior to Admission medications   Medication Sig Start Date End Date Taking? Authorizing Provider  citalopram (CELEXA) 20 MG tablet Take 1 tablet (20 mg total) by mouth daily. 01/20/21  Yes Sowles, Drue Stager, MD  estradiol (ESTRACE) 0.5 MG tablet Take 1 tablet (0.5 mg total) by mouth daily. 12/25/20  Yes Sowles, Drue Stager, MD  albuterol (VENTOLIN HFA) 108 (90 Base) MCG/ACT inhaler Inhale 2 puffs into the lungs every 6 (six) hours as needed for wheezing or shortness of breath. 12/25/20   Steele Sizer, MD  cloNIDine (CATAPRES) 0.1 MG tablet Take 1 tablet (0.1 mg total) by mouth at bedtime. 12/25/20   Steele Sizer, MD  doxycycline (ORACEA) 40 MG capsule TAKE ONE CAPSULE BY MOUTH EVERY DAY WITH FOOD AND WATER 12/23/18   [provider]  EPIDUO FORTE 0.3-2.5 % GEL APPLY thin layer TO FACE AT bedtime; [place] off IN THE MORNING] 05/01/19   [provider]  hydrOXYzine (ATARAX/VISTARIL) 10 MG tablet Take 1-2 tablets (10-20 mg total) by mouth at bedtime as needed. 01/06/21   Steele Sizer, MD  nitrofurantoin, macrocrystal-monohydrate, (MACROBID) 100 MG capsule 1 capsule twice daily for 5 days at first onset of UTI symptoms and prn intercourse 12/25/20   Steele Sizer, MD    Allergies as of 01/09/2021    (No Known Allergies)    Family History  Problem Relation Age of Onset   Hypercholesterolemia Mother    Heart block Mother        Put a Stent in   Hypertension Father    Breast cancer Neg Hx     Social History   Socioeconomic History   Marital status: Married    Spouse name: Broadus John    Number of children: 0   Years of education: Not on file   Highest education level: 12th grade  Occupational History   Occupation: fund transfer   Tobacco Use   Smoking status: Former Smoker    Packs/day: 0.50    Years: 12.00    Pack years: 6.00    Types: Cigarettes    Start date: 11/24/1999    Quit date: 11/03/2012    Years since quitting: 8.2   Smokeless tobacco: Never Used  Vaping Use   Vaping Use: Never used  Substance and Sexual Activity   Alcohol use: No    Alcohol/week: 0.0 standard drinks   Drug use: No   Sexual activity: Yes    Partners: Male    Birth control/protection: Surgical  Other Topics Concern   Not on file  Social History Narrative   Married, never had children secondary to gyn problems and two miscarriages, has one dog    Social Determinants of Health   Financial Resource Strain: Low Risk    Difficulty  of Paying Living Expenses: Not hard at all  Food Insecurity: No Food Insecurity   Worried About [place] in the Last Year: Never true   [place] in the Last Year: Never true  Transportation Needs: No Transportation Needs   Lack of Transportation (Medical): No   Lack of Transportation (Non-Medical): No  Physical Activity: Insufficiently Active   Days of Exercise per Week: 2 days   Minutes of Exercise per Session: 20 min  Stress: No Stress Concern Present   Feeling of Stress : Only a little  Social Connections: Moderately Integrated   Frequency of Communication with Friends and Family: More than three times a week   Frequency of Social Gatherings with Friends and Family: Once a week   Attends Religious Services: 1  to 4 times per year   Active Member of [employer] or Organizations: No   Attends Music therapist: Never   Marital Status: Married  Human resources officer Violence: Not At Risk   Fear of Current or Ex-Partner: No   Emotionally Abused: No   Physically Abused: No   Sexually Abused: No    Review of Systems: See HPI, otherwise negative ROS  Physical Exam: There were no vitals taken for this visit. General:   Alert,  pleasant and cooperative in NAD Head:  Normocephalic and atraumatic. Neck:  Supple; no masses or thyromegaly. Lungs:  Clear throughout to auscultation, normal respiratory effort.    Heart:  +S1, +S2, Regular rate and rhythm, No edema. Abdomen:  Soft, nontender and nondistended. Normal bowel sounds, without guarding, and without rebound.   Neurologic:  Alert and  oriented x4;  grossly normal neurologically.  Impression/Plan: Rachel Caldwell is here for a colonoscopy to be performed for average risk screening.  Risks, benefits, limitations, and alternatives regarding  colonoscopy have been reviewed with the patient.  Questions have been answered.  All parties agreeable.   Virgel Manifold, MD  02/07/2021, 10:50 AM

## 2021-02-09 NOTE — Anesthesia Postprocedure Evaluation (Signed)
Anesthesia Post Note  Patient: Rachel Caldwell  Procedure(s) Performed: COLONOSCOPY WITH PROPOFOL (N/A )  Patient location during evaluation: PACU Anesthesia Type: General Level of consciousness: awake and alert Pain management: pain level controlled Vital Signs Assessment: post-procedure vital signs reviewed and stable Respiratory status: spontaneous breathing, nonlabored ventilation and respiratory function stable Cardiovascular status: blood pressure returned to baseline and stable Postop Assessment: no apparent nausea or vomiting Anesthetic complications: no   No complications documented.   Last Vitals:  Vitals:   02/07/21 1157 02/07/21 1207  BP: 103/64 (!) 137/91  Pulse: 65 (!) 58  Resp: 15 18  Temp:    SpO2: 99% 100%    Last Pain:  Vitals:   02/07/21 1207  TempSrc:   PainSc: 0-No pain                 Brett Canales Ramsdell

## 2021-02-10 LAB — SURGICAL PATHOLOGY

## 2021-02-12 ENCOUNTER — Other Ambulatory Visit: Payer: Self-pay | Admitting: Family Medicine

## 2021-02-12 DIAGNOSIS — J4520 Mild intermittent asthma, uncomplicated: Secondary | ICD-10-CM

## 2021-02-12 NOTE — Telephone Encounter (Signed)
Requested Prescriptions  Pending Prescriptions Disp Refills  . albuterol (VENTOLIN HFA) 108 (90 Base) MCG/ACT inhaler [Pharmacy Med Name: ALBUTEROL HFA INH(200 PUFFS)18GM] 18 g 1    Sig: INHALE 2 PUFFS INTO THE LUNGS EVERY 6 HOURS AS NEEDED FOR WHEEZING OR SHORTNESS OF BREATH     Pulmonology:  Beta Agonists Failed - 02/12/2021  3:15 AM      Failed - One inhaler should last at least one month. If the patient is requesting refills earlier, contact the patient to check for uncontrolled symptoms.      Passed - Valid encounter within last 12 months    Recent Outpatient Visits          1 month ago Generalized anxiety disorder   [facility] Steele Sizer, MD   1 year ago Well adult exam   [facility] Steele Sizer, MD   2 years ago Well woman exam (no gynecological exam)   [facility] Steele Sizer, MD   3 years ago Generalized anxiety disorder   [facility] Steele Sizer, MD   3 years ago Generalized anxiety disorder   [place], [place]      Future Appointments            In 1 week Ralene Bathe, MD [place]   In 10 months Steele Sizer, MD [facility], [facility]

## 2021-02-18 ENCOUNTER — Other Ambulatory Visit: Payer: Self-pay

## 2021-02-18 DIAGNOSIS — R61 Generalized hyperhidrosis: Secondary | ICD-10-CM

## 2021-02-19 ENCOUNTER — Other Ambulatory Visit: Payer: Self-pay

## 2021-02-24 ENCOUNTER — Ambulatory Visit: Payer: BC Managed Care – PPO | Admitting: Dermatology

## 2021-02-24 ENCOUNTER — Other Ambulatory Visit: Payer: Self-pay | Admitting: Family Medicine

## 2021-02-24 ENCOUNTER — Other Ambulatory Visit: Payer: Self-pay

## 2021-02-24 DIAGNOSIS — L821 Other seborrheic keratosis: Secondary | ICD-10-CM

## 2021-02-24 DIAGNOSIS — D1800 Hemangioma unspecified site: Secondary | ICD-10-CM

## 2021-02-24 DIAGNOSIS — L820 Inflamed seborrheic keratosis: Secondary | ICD-10-CM

## 2021-02-24 DIAGNOSIS — Z1283 Encounter for screening for malignant neoplasm of skin: Secondary | ICD-10-CM

## 2021-02-24 DIAGNOSIS — Z85828 Personal history of other malignant neoplasm of skin: Secondary | ICD-10-CM

## 2021-02-24 DIAGNOSIS — D2239 Melanocytic nevi of other parts of face: Secondary | ICD-10-CM

## 2021-02-24 DIAGNOSIS — R61 Generalized hyperhidrosis: Secondary | ICD-10-CM

## 2021-02-24 DIAGNOSIS — L814 Other melanin hyperpigmentation: Secondary | ICD-10-CM

## 2021-02-24 DIAGNOSIS — D229 Melanocytic nevi, unspecified: Secondary | ICD-10-CM

## 2021-02-24 DIAGNOSIS — D235 Other benign neoplasm of skin of trunk: Secondary | ICD-10-CM

## 2021-02-24 DIAGNOSIS — D489 Neoplasm of uncertain behavior, unspecified: Secondary | ICD-10-CM

## 2021-02-24 DIAGNOSIS — D225 Melanocytic nevi of trunk: Secondary | ICD-10-CM | POA: Diagnosis not present

## 2021-02-24 DIAGNOSIS — L578 Other skin changes due to chronic exposure to nonionizing radiation: Secondary | ICD-10-CM

## 2021-02-24 DIAGNOSIS — D239 Other benign neoplasm of skin, unspecified: Secondary | ICD-10-CM

## 2021-02-24 MED ORDER — CLONIDINE HCL 0.1 MG PO TABS: 0.1000 mg | ORAL_TABLET | Freq: Every evening | ORAL | 3 refills | Status: DC

## 2021-02-24 MED ORDER — CITALOPRAM HYDROBROMIDE 20 MG PO TABS: 20.0000 mg | ORAL_TABLET | Freq: Every day | ORAL | 1 refills | Status: DC

## 2021-02-24 NOTE — Patient Instructions (Signed)
Cryotherapy Aftercare  . Wash gently with soap and water everyday.   Marland Kitchen Apply Vaseline and Band-Aid daily until healed.  Melanoma ABCDEs  Melanoma is the most dangerous type of skin cancer, and is the leading cause of death from skin disease.  You are more likely to develop melanoma if you:  Have light-colored skin, light-colored eyes, or red or blond hair  Spend a lot of time in the sun  Tan regularly, either outdoors or in a tanning bed  Have had blistering sunburns, especially during childhood  Have a close family member who has had a melanoma  Have atypical moles or large birthmarks  Early detection of melanoma is key since treatment is typically straightforward and cure rates are extremely high if we catch it early.   The first sign of melanoma is often a change in a mole or a new dark spot.  The ABCDE system is a way of remembering the signs of melanoma.  A for asymmetry:  The two halves do not match. B for border:  The edges of the growth are irregular. C for color:  A mixture of colors are present instead of an even brown color. D for diameter:  Melanomas are usually (but not always) greater than 48mm - the size of a pencil eraser. E for evolution:  The spot keeps changing in size, shape, and color.  Please check your skin once per month between visits. You can use a small mirror in front and a large mirror behind you to keep an eye on the back side or your body.   If you see any new or changing lesions before your next follow-up, please call to schedule a visit.  Please continue daily skin protection including broad spectrum sunscreen SPF 30+ to sun-exposed areas, reapplying every 2 hours as needed when you're outdoors.   Staying in the shade or wearing long sleeves, sun glasses (UVA+UVB protection) and wide brim hats (4-inch brim around the entire circumference of the hat) are also recommended for sun protection.   Biopsy Wound Care Instructions  1. Leave the original  bandage on for 24 hours if possible.  If the bandage becomes soaked or soiled before that time, it is OK to remove it and examine the wound.  A small amount of post-operative bleeding is normal.  If excessive bleeding occurs, remove the bandage, place gauze over the site and apply continuous pressure (no peeking) over the area for 30 minutes. If this does not work, please call our clinic as soon as possible or page your doctor if it is after hours.   2. Once a day, cleanse the wound with soap and water. It is fine to shower. If a thick crust develops you may use a Q-tip dipped into dilute hydrogen peroxide (mix 1:1 with water) to dissolve it.  Hydrogen peroxide can slow the healing process, so use it only as needed.    3. After washing, apply petroleum jelly (Vaseline) or an antibiotic ointment if your doctor prescribed one for you, followed by a bandage.    4. For best healing, the wound should be covered with a layer of ointment at all times. If you are not able to keep the area covered with a bandage to hold the ointment in place, this may mean re-applying the ointment several times a day.  Continue this wound care until the wound has healed and is no longer open.   Itching and mild discomfort is normal during the healing process. However,  if you develop pain or severe itching, please call our office.   If you have any discomfort, you can take Tylenol (acetaminophen) or ibuprofen as directed on the bottle. (Please do not take these if you have an allergy to them or cannot take them for another reason).  Some redness, tenderness and white or yellow material in the wound is normal healing.  If the area becomes very sore and red, or develops a thick yellow-green material (pus), it may be infected; please notify us.    If you have stitches, return to clinic as directed to have the stitches removed. You will continue wound care for 2-3 days after the stitches are removed.   Wound healing continues for  up to one year following surgery. It is not unusual to experience pain in the scar from time to time during the interval.  If the pain becomes severe or the scar thickens, you should notify the office.    A slight amount of redness in a scar is expected for the first six months.  After six months, the redness will fade and the scar will soften and fade.  The color difference becomes less noticeable with time.  If there are any problems, return for a post-op surgery check at your earliest convenience.  To improve the appearance of the scar, you can use silicone scar gel, cream, or sheets (such as Mederma or Serica) every night for up to one year. These are available over the counter (without a prescription).  Please call our office at [phone] for any questions or concerns.   If you have any questions or concerns for your doctor, please call our main line at [phone] and press option 4 to reach your doctor's medical assistant. If no one answers, please leave a voicemail as directed and we will return your call as soon as possible. Messages left after 4 pm will be answered the following business day.   You may also send [place] a message via [place]. We typically respond to MyChart messages within 1-2 business days.  For prescription refills, please ask your pharmacy to contact our office. Our fax number is [phone].  If you have an urgent issue when the clinic is closed that cannot wait until the next business day, you can page your doctor at the number below.    Please note that while we do our best to be available for urgent issues outside of office hours, we are not available 24/7.   If you have an urgent issue and are unable to reach [place], you may choose to seek medical care at your doctor's office, retail clinic, urgent care center, or emergency room.  If you have a medical emergency, please immediately call 911 or go to the emergency department.  Pager Numbers  - Dr. Nehemiah Massed:  [phone]  - Dr. Laurence Ferrari: [phone]  - Dr. Nicole Kindred: [phone]  In the event of inclement weather, please call our main line at [phone] for an update on the status of any delays or closures.  Dermatology Medication Tips: Please keep the boxes that topical medications come in in order to help keep track of the instructions about where and how to use these. Pharmacies typically print the medication instructions only on the boxes and not directly on the medication tubes.   If your medication is too expensive, please contact our office at [phone] option 4 or send [place] a message through [place].   We are unable to tell what your co-pay for medications will  be in advance as this is different depending on your insurance coverage. However, we may be able to find a substitute medication at lower cost or fill out paperwork to get insurance to cover a needed medication.   If a prior authorization is required to get your medication covered by your insurance company, please allow [place] 1-2 business days to complete this process.  Drug prices often vary depending on where the prescription is filled and some pharmacies may offer cheaper prices.  The website www.goodrx.com contains coupons for medications through different pharmacies. The prices here do not account for what the cost may be with help from insurance (it may be cheaper with your insurance), but the website can give you the price if you did not use any insurance.  - You can print the associated coupon and take it with your prescription to the pharmacy.  - You may also stop by our office during regular business hours and pick up a GoodRx coupon card.  - If you need your prescription sent electronically to a different pharmacy, notify our office through [facility] or by phone at [phone] option 4.

## 2021-02-24 NOTE — Progress Notes (Signed)
Follow-Up Visit   Subjective  Rachel Caldwell is a 50 y.o. female who presents for the following: tbse (Patient here today for tbse. Patient has spot on nose she would like checked. Patient has inflamed area on left inner thigh she noticed while shaving. /Patient noticed a dry spot on right hand she would like looked at. Patient has history of dysplastic nevi and history of bcc. ). Patient here for full body skin exam and skin cancer screening.  The following portions of the chart were reviewed this encounter and updated as appropriate:  Tobacco  Allergies  Meds  Problems  Med Hx  Surg Hx  Fam Hx      Objective  Well appearing patient in no apparent distress; mood and affect are within normal limits.  A full examination was performed including scalp, head, eyes, ears, nose, lips, neck, chest, axillae, abdomen, back, buttocks, bilateral upper extremities, bilateral lower extremities, hands, feet, fingers, toes, fingernails, and toenails. All findings within normal limits unless otherwise noted below.  Objective  Nose: 1.25 mm red fibrous papule   Images    Objective  right mid to lower back 2 cm lateral to spine: 0.6 cm irregular brown macule    Objective  left pubic: 0.6 x 0.4 irregular brown macule          Objective  left groin: 0.6 x 0.4 cm brown macule   Images    Objective  Right Dorsal Hand x 2 (2): Erythematous keratotic or waxy stuck-on papule or plaque.   Assessment & Plan  Fibrous papule of nose Nose Photo today Discussed removal if change or if symptomatic. Benign-appearing.  Observation.  Call clinic for new or changing moles.  Recommend daily use of broad spectrum spf 30+ sunscreen to sun-exposed areas.   Neoplasm of uncertain behavior (2) right mid to lower back 2 cm lateral to spine Epidermal / dermal shaving  Lesion diameter (cm):  0.6 Informed consent: discussed and consent obtained   Timeout: patient name, date of birth,  surgical site, and procedure verified   Patient was prepped and draped in usual sterile fashion: area prepped with isopropyl alcohol. Anesthesia: the lesion was anesthetized in a standard fashion   Anesthetic:  1% lidocaine w/ epinephrine 1-100,000 buffered w/ 8.4% NaHCO3 Instrument used: flexible razor blade   Hemostasis achieved with: aluminum chloride   Outcome: patient tolerated procedure well   Post-procedure details: wound care instructions given   Additional details:  Mupirocin and a bandage applied  Specimen 1 - Surgical pathology Differential Diagnosis: r/o nevus vs dysplastic   Check Margins: No 0.6 cm irregular brown macule   Melanocytic Nevi - Tan-brown and/or pink-flesh-colored symmetric macules and papules - Benign appearing on exam today - Observation - Call clinic for new or changing moles - Recommend daily use of broad spectrum spf 30+ sunscreen to sun-exposed areas.  left pubic nevus For left pubic - photo today - will recheck at next appointment  Benign-appearing.  Observation.  Call clinic for new or changing lesions.  Recommend daily use of broad spectrum spf 30+ sunscreen to sun-exposed areas.   Dermatofibroma left groin History of no change Photo today Benign-appearing.  Observation.  Call clinic for new or changing moles.  Recommend daily use of broad spectrum spf 30+ sunscreen to sun-exposed areas.   Inflamed seborrheic keratosis (2) Right Dorsal Hand x 2 Prior to procedure, discussed risks of blister formation, small wound, skin dyspigmentation, or rare scar following cryotherapy.  Destruction of lesion -  Right Dorsal Hand x 2 Complexity: simple   Destruction method: cryotherapy   Informed consent: discussed and consent obtained   Timeout:  patient name, date of birth, surgical site, and procedure verified Lesion destroyed using liquid nitrogen: Yes   Region frozen until ice ball extended beyond lesion: Yes   Outcome: patient tolerated procedure  well with no complications   Post-procedure details: wound care instructions given    Skin cancer screening  Lentigines - Scattered tan macules - Due to sun exposure - Benign-appering, observe - Recommend daily broad spectrum sunscreen SPF 30+ to sun-exposed areas, reapply every 2 hours as needed. - Call for any changes  Seborrheic Keratoses - Stuck-on, waxy, tan-brown papules and/or plaques  - Benign-appearing - Discussed benign etiology and prognosis. - Observe - Call for any changes  Melanocytic Nevi - Tan-brown and/or pink-flesh-colored symmetric macules and papules - Benign appearing on exam today - Observation - Call clinic for new or changing moles - Recommend daily use of broad spectrum spf 30+ sunscreen to sun-exposed areas.   Hemangiomas - Red papules - Discussed benign nature - Observe - Call for any changes  Actinic Damage - Chronic condition, secondary to cumulative UV/sun exposure - diffuse scaly erythematous macules with underlying dyspigmentation - Recommend daily broad spectrum sunscreen SPF 30+ to sun-exposed areas, reapply every 2 hours as needed.  - Staying in the shade or wearing long sleeves, sun glasses (UVA+UVB protection) and wide brim hats (4-inch brim around the entire circumference of the hat) are also recommended for sun protection.  - Call for new or changing lesions.  History of Basal Cell Carcinoma of the Skin - No evidence of recurrence today left mid to medial pretibial , right posterior thigh,  - Recommend regular full body skin exams - Recommend daily broad spectrum sunscreen SPF 30+ to sun-exposed areas, reapply every 2 hours as needed.  - Call if any new or changing lesions are noted between office visits  Skin cancer screening performed today.  Return in about 7 months (around 09/26/2021) for tbse.  IRuthell Rummage, CMA, am acting as scribe for Sarina Ser, MD.  Documentation: I have reviewed the above documentation for  accuracy and completeness, and I agree with the above.  Sarina Ser, MD

## 2021-02-25 ENCOUNTER — Encounter: Payer: Self-pay | Admitting: Dermatology

## 2021-02-27 ENCOUNTER — Telehealth: Payer: Self-pay

## 2021-02-27 NOTE — Telephone Encounter (Signed)
Patient advised of bx results and scheduled surgery.

## 2021-02-27 NOTE — Telephone Encounter (Signed)
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

## 2021-02-27 NOTE — Telephone Encounter (Signed)
Left message on voicemail to return my call.  

## 2021-02-28 ENCOUNTER — Encounter: Payer: Self-pay | Admitting: Gastroenterology

## 2021-03-04 DIAGNOSIS — M5412 Radiculopathy, cervical region: Secondary | ICD-10-CM | POA: Diagnosis not present

## 2021-04-08 ENCOUNTER — Ambulatory Visit: Payer: BC Managed Care – PPO | Admitting: Dermatology

## 2021-04-08 ENCOUNTER — Other Ambulatory Visit: Payer: Self-pay

## 2021-04-08 ENCOUNTER — Encounter: Payer: Self-pay | Admitting: Dermatology

## 2021-04-08 DIAGNOSIS — D239 Other benign neoplasm of skin, unspecified: Secondary | ICD-10-CM

## 2021-04-08 DIAGNOSIS — D235 Other benign neoplasm of skin of trunk: Secondary | ICD-10-CM

## 2021-04-08 DIAGNOSIS — L988 Other specified disorders of the skin and subcutaneous tissue: Secondary | ICD-10-CM | POA: Diagnosis not present

## 2021-04-08 MED ORDER — MUPIROCIN 2 % EX OINT: 1.0000 "application " | TOPICAL_OINTMENT | Freq: Every day | CUTANEOUS | 0 refills | Status: DC

## 2021-04-08 NOTE — Patient Instructions (Signed)
If you have any questions or concerns for your doctor, please call our main line at [phone] and press option 4 to reach your doctor's medical assistant. If no one answers, please leave a voicemail as directed and we will return your call as soon as possible. Messages left after 4 pm will be answered the following business day.   You may also send us a message via MyChart. We typically respond to MyChart messages within 1-2 business days.  For prescription refills, please ask your pharmacy to contact our office. Our fax number is [phone].  If you have an urgent issue when the clinic is closed that cannot wait until the next business day, you can page your doctor at the number below.    Please note that while we do our best to be available for urgent issues outside of office hours, we are not available 24/7.   If you have an urgent issue and are unable to reach us, you may choose to seek medical care at your doctor's office, retail clinic, urgent care center, or emergency room.  If you have a medical emergency, please immediately call 911 or go to the emergency department.  Pager Numbers  - Dr. Kowalski: [phone]  - Dr. Moye: [phone]  - Dr. Stewart: [phone]  In the event of inclement weather, please call our main line at [phone] for an update on the status of any delays or closures.  Dermatology Medication Tips: Please keep the boxes that topical medications come in in order to help keep track of the instructions about where and how to use these. Pharmacies typically print the medication instructions only on the boxes and not directly on the medication tubes.   If your medication is too expensive, please contact our office at [phone] option 4 or send us a message through MyChart.   We are unable to tell what your co-pay for medications will be in advance as this is different depending on your insurance coverage. However, we may be able to find a  substitute medication at lower cost or fill out paperwork to get insurance to cover a needed medication.   If a prior authorization is required to get your medication covered by your insurance company, please allow us 1-2 business days to complete this process.  Drug prices often vary depending on where the prescription is filled and some pharmacies may offer cheaper prices.  The website www.goodrx.com contains coupons for medications through different pharmacies. The prices here do not account for what the cost may be with help from insurance (it may be cheaper with your insurance), but the website can give you the price if you did not use any insurance.  - You can print the associated coupon and take it with your prescription to the pharmacy.  - You may also stop by our office during regular business hours and pick up a GoodRx coupon card.  - If you need your prescription sent electronically to a different pharmacy, notify our office through [facility] MyChart or by phone at [phone] option 4.     Wound Care Instructions  1. Cleanse wound gently with soap and water once a day then pat dry with clean gauze. Apply a thing coat of Petrolatum (petroleum jelly, "Vaseline") over the wound (unless you have an allergy to this). We recommend that you use a new, sterile tube of Vaseline. Do not pick or remove scabs. Do not remove the yellow or white "healing tissue" from the base of the wound.    2. Cover the wound with fresh, clean, nonstick gauze and secure with paper tape. You may use Band-Aids in place of gauze and tape if the would is small enough, but would recommend trimming much of the tape off as there is often too much. Sometimes Band-Aids can irritate the skin.  3. You should call the office for your biopsy report after 1 week if you have not already been contacted.  4. If you experience any problems, such as abnormal amounts of bleeding, swelling, significant bruising, significant pain,  or evidence of infection, please call the office immediately.  5. FOR ADULT SURGERY PATIENTS: If you need something for pain relief you may take 1 extra strength Tylenol (acetaminophen) AND 2 Ibuprofen (200mg each) together every 4 hours as needed for pain. (do not take these if you are allergic to them or if you have a reason you should not take them.) Typically, you may only need pain medication for 1 to 3 days.     

## 2021-04-08 NOTE — Progress Notes (Signed)
   Follow-Up Visit   Subjective  Rachel Caldwell is a 50 y.o. female who presents for the following: Severe dysplastic nevus, bx proven (R mid to lower back, 2.0cm lat to spine, pt presents for excision).  The following portions of the chart were reviewed this encounter and updated as appropriate:   Tobacco  Allergies  Meds  Problems  Med Hx  Surg Hx  Fam Hx     Review of Systems:  No other skin or systemic complaints except as noted in HPI or Assessment and Plan.  Objective  Well appearing patient in no apparent distress; mood and affect are within normal limits.  A focused examination was performed including back. Relevant physical exam findings are noted in the Assessment and Plan.  Objective  Right mid to lower back, 2.0 cm lat to spine: Pink bx site 1.2 x 0.6cm   Assessment & Plan  Dysplastic nevus Right mid to lower back, 2.0 cm lat to spine  Severe atypia, bx proven  Start Mupirocin oint qd to excision stie  Skin excision - Right mid to lower back, 2.0 cm lat to spine  Lesion length (cm):  1.2 Lesion width (cm):  0.6 Margin per side (cm):  0.2 Total excision diameter (cm):  1.6 Informed consent: discussed and consent obtained   Timeout: patient name, date of birth, surgical site, and procedure verified   Procedure prep:  Patient was prepped and draped in usual sterile fashion Prep type:  Isopropyl alcohol and povidone-iodine Anesthesia: the lesion was anesthetized in a standard fashion   Anesthetic:  1% lidocaine w/ epinephrine 1-100,000 buffered w/ 8.4% NaHCO3 (9cc) Instrument used: #15 blade   Hemostasis achieved with: pressure   Hemostasis achieved with comment:  Electrocautery Outcome: patient tolerated procedure well with no complications   Post-procedure details: sterile dressing applied and wound care instructions given   Dressing type: bandage and pressure dressing (Mupirocin)    Skin repair - Right mid to lower back, 2.0 cm lat to  spine Complexity:  Complex Final length (cm):  3 Reason for type of repair: reduce tension to allow closure, reduce the risk of dehiscence, infection, and necrosis, reduce subcutaneous dead space and avoid a hematoma, allow closure of the large defect, preserve normal anatomy, preserve normal anatomical and functional relationships and enhance both functionality and cosmetic results   Undermining: area extensively undermined   Undermining comment:  Undermining Defect 1.6cm Subcutaneous layers (deep stitches):  Suture size:  2-0 Suture type: Vicryl (polyglactin 910)   Subcutaneous suture technique: Inverted Dermal. Fine/surface layer approximation (top stitches):  Suture size:  3-0 Suture type: nylon   Stitches: simple running   Suture removal (days):  7 Hemostasis achieved with: pressure Outcome: patient tolerated procedure well with no complications   Post-procedure details: sterile dressing applied and wound care instructions given   Dressing type: bandage, pressure dressing and bacitracin (Mupirocin)    mupirocin ointment (BACTROBAN) 2 % - Right mid to lower back, 2.0 cm lat to spine  Specimen 1 - Surgical pathology Differential Diagnosis: Bx proven Severe Dysplastic Nevus  Check Margins: yes Pink bx site 1.2 x 0.6cm [id number]  Return in about 1 week (around 04/15/2021) for suture removal.  I, Othelia Pulling, RMA, am acting as scribe for Sarina Ser, MD .  Documentation: I have reviewed the above documentation for accuracy and completeness, and I agree with the above.  Sarina Ser, MD

## 2021-04-15 ENCOUNTER — Ambulatory Visit (INDEPENDENT_AMBULATORY_CARE_PROVIDER_SITE_OTHER): Payer: BC Managed Care – PPO | Admitting: Dermatology

## 2021-04-15 ENCOUNTER — Other Ambulatory Visit: Payer: Self-pay

## 2021-04-15 DIAGNOSIS — Z4802 Encounter for removal of sutures: Secondary | ICD-10-CM

## 2021-04-15 DIAGNOSIS — Z86018 Personal history of other benign neoplasm: Secondary | ICD-10-CM

## 2021-04-15 NOTE — Progress Notes (Signed)
   Follow-Up Visit   Subjective  Rachel Caldwell is a 50 y.o. female who presents for the following: suture removal (Margins free severely dysplastic nevus of the right mid to lower back, 2.0 cm lat to spine).  The following portions of the chart were reviewed this encounter and updated as appropriate:   Tobacco  Allergies  Meds  Problems  Med Hx  Surg Hx  Fam Hx     Review of Systems:  No other skin or systemic complaints except as noted in HPI or Assessment and Plan.  Objective  Well appearing patient in no apparent distress; mood and affect are within normal limits.  A focused examination was performed including the back. Relevant physical exam findings are noted in the Assessment and Plan.  Objective  right mid to lower back, 2.0 cm lat to spine: Healing excision site.  Assessment & Plan  History of dysplastic nevus right mid to lower back, 2.0 cm lat to spine  Encounter for Removal of Sutures - Incision site at the right mid to lower back, 2.0 cm lat to spine is clean, dry and intact - Wound cleansed, sutures removed, wound cleansed and steri strips applied.  - Discussed pathology results showing margins free dysplastic nevus - Patient advised to keep steri-strips dry until they fall off. - Scars remodel for a full year. - Once steri-strips fall off, patient can apply over-the-counter silicone scar cream each night to help with scar remodeling if desired. - Patient advised to call with any concerns or if they notice any new or changing lesions.   Return for appointment as scheduled.  Luther Redo, CMA, am acting as scribe for Sarina Ser, MD .  Documentation: I have reviewed the above documentation for accuracy and completeness, and I agree with the above.  Sarina Ser, MD

## 2021-04-15 NOTE — Patient Instructions (Signed)

## 2021-04-16 ENCOUNTER — Encounter: Payer: Self-pay | Admitting: Dermatology

## 2021-04-19 ENCOUNTER — Encounter: Payer: Self-pay | Admitting: Dermatology

## 2021-05-22 ENCOUNTER — Telehealth: Payer: Self-pay | Admitting: Family Medicine

## 2021-05-22 ENCOUNTER — Other Ambulatory Visit: Payer: Self-pay | Admitting: Family Medicine

## 2021-05-22 MED ORDER — FLUCONAZOLE 150 MG PO TABS: 150.0000 mg | ORAL_TABLET | ORAL | 0 refills | Status: DC

## 2021-05-22 NOTE — Telephone Encounter (Signed)
Pt has on going rx for generic macrobid and she now has yeast infection itching and vaginal discharge and would like diflucan sent to [facility]  [address] in [place]

## 2021-08-20 ENCOUNTER — Other Ambulatory Visit: Payer: Self-pay

## 2021-08-20 ENCOUNTER — Ambulatory Visit
Admission: RE | Admit: 2021-08-20 | Discharge: 2021-08-20 | Disposition: A | Discharge status: Home / Self Care | Payer: BC Managed Care – PPO | Source: Ambulatory Visit | Attending: Family Medicine | Admitting: Family Medicine

## 2021-08-20 DIAGNOSIS — Z1231 Encounter for screening mammogram for malignant neoplasm of breast: Secondary | ICD-10-CM | POA: Diagnosis not present

## 2021-08-24 ENCOUNTER — Other Ambulatory Visit: Payer: Self-pay | Admitting: Family Medicine

## 2021-09-20 ENCOUNTER — Encounter: Payer: Self-pay | Admitting: Family Medicine

## 2021-09-22 ENCOUNTER — Other Ambulatory Visit: Payer: Self-pay | Admitting: Family Medicine

## 2021-09-22 DIAGNOSIS — N390 Urinary tract infection, site not specified: Secondary | ICD-10-CM

## 2021-09-22 MED ORDER — NITROFURANTOIN MONOHYD MACRO 100 MG PO CAPS: ORAL_CAPSULE | ORAL | 0 refills | Status: DC

## 2021-10-09 ENCOUNTER — Other Ambulatory Visit: Payer: Self-pay

## 2021-10-09 ENCOUNTER — Ambulatory Visit: Payer: BC Managed Care – PPO | Admitting: Dermatology

## 2021-10-09 DIAGNOSIS — D1801 Hemangioma of skin and subcutaneous tissue: Secondary | ICD-10-CM

## 2021-10-09 DIAGNOSIS — Z1283 Encounter for screening for malignant neoplasm of skin: Secondary | ICD-10-CM

## 2021-10-09 DIAGNOSIS — I781 Nevus, non-neoplastic: Secondary | ICD-10-CM

## 2021-10-09 DIAGNOSIS — L988 Other specified disorders of the skin and subcutaneous tissue: Secondary | ICD-10-CM

## 2021-10-09 DIAGNOSIS — L814 Other melanin hyperpigmentation: Secondary | ICD-10-CM

## 2021-10-09 DIAGNOSIS — D229 Melanocytic nevi, unspecified: Secondary | ICD-10-CM

## 2021-10-09 DIAGNOSIS — L820 Inflamed seborrheic keratosis: Secondary | ICD-10-CM | POA: Diagnosis not present

## 2021-10-09 DIAGNOSIS — D280 Benign neoplasm of vulva: Secondary | ICD-10-CM

## 2021-10-09 DIAGNOSIS — L578 Other skin changes due to chronic exposure to nonionizing radiation: Secondary | ICD-10-CM

## 2021-10-09 DIAGNOSIS — D492 Neoplasm of unspecified behavior of bone, soft tissue, and skin: Secondary | ICD-10-CM

## 2021-10-09 DIAGNOSIS — Z86018 Personal history of other benign neoplasm: Secondary | ICD-10-CM | POA: Diagnosis not present

## 2021-10-09 DIAGNOSIS — L821 Other seborrheic keratosis: Secondary | ICD-10-CM

## 2021-10-09 DIAGNOSIS — Z85828 Personal history of other malignant neoplasm of skin: Secondary | ICD-10-CM

## 2021-10-09 DIAGNOSIS — D282 Benign neoplasm of uterine tubes and ligaments: Secondary | ICD-10-CM | POA: Diagnosis not present

## 2021-10-09 NOTE — Progress Notes (Signed)
Follow-Up Visit   Subjective  Rachel Caldwell is a 50 y.o. female who presents for the following: Total body skin exam (Hx of Dysplastic nevi, hx of BCCs). She has multiple areas to be evaluated today. The patient presents for Total-Body Skin Exam (TBSE) for skin cancer screening and mole check.  The following portions of the chart were reviewed this encounter and updated as appropriate:   Tobacco  Allergies  Meds  Problems  Med Hx  Surg Hx  Fam Hx     Review of Systems:  No other skin or systemic complaints except as noted in HPI or Assessment and Plan.  Objective  Well appearing patient in no apparent distress; mood and affect are within normal limits.  A full examination was performed including scalp, head, eyes, ears, nose, lips, neck, chest, axillae, abdomen, back, buttocks, bilateral upper extremities, bilateral lower extremities, hands, feet, fingers, toes, fingernails, and toenails. All findings within normal limits unless otherwise noted below.  left mid to med pretibial medial, right post thigh Well healed scars with no evidence of recurrence.   L post thigh, R mid to lower back 2.0cm lat to spine Scars with no evidence of recurrence.   Nose, R forearm Dilated vessels R forearm 0.3cm, nose 0.2cm     R volar forearm near the elbow x 2, Total = 2 Erythematous keratotic or waxy stuck-on papule or plaque.   L superior pubic 0.6cm irregular brown macule     face Rhytides and volume loss.    Assessment & Plan   Lentigines - Scattered tan macules - Due to sun exposure - Benign-appearing, observe - Recommend daily broad spectrum sunscreen SPF 30+ to sun-exposed areas, reapply every 2 hours as needed. - Call for any changes  Seborrheic Keratoses - Stuck-on, waxy, tan-brown papules and/or plaques  - Benign-appearing - Discussed benign etiology and prognosis. - Observe - Call for any changes  Melanocytic Nevi - Tan-brown and/or pink-flesh-colored  symmetric macules and papules - Benign appearing on exam today - Observation - Call clinic for new or changing moles - Recommend daily use of broad spectrum spf 30+ sunscreen to sun-exposed areas.   Hemangiomas - Red papules - Discussed benign nature - Observe - Call for any changes  Actinic Damage - Chronic condition, secondary to cumulative UV/sun exposure - diffuse scaly erythematous macules with underlying dyspigmentation - Recommend daily broad spectrum sunscreen SPF 30+ to sun-exposed areas, reapply every 2 hours as needed.  - Staying in the shade or wearing long sleeves, sun glasses (UVA+UVB protection) and wide brim hats (4-inch brim around the entire circumference of the hat) are also recommended for sun protection.  - Call for new or changing lesions.  Skin cancer screening performed today.  History of basal cell carcinoma (BCC) left mid to med pretibial medial, right post thigh Clear. Observe for recurrence. Call clinic for new or changing lesions.  Recommend regular skin exams, daily broad-spectrum spf 30+ sunscreen use, and photoprotection.    History of dysplastic nevus L post thigh, R mid to lower back 2.0cm lat to spine Clear. Observe for recurrence. Call clinic for new or changing lesions.  Recommend regular skin exams, daily broad-spectrum spf 30+ sunscreen use, and photoprotection.    Telangiectasia Nose, R forearm Benign, observe.   Discussed laser/BBL treatment option.  Inflamed seborrheic keratosis R volar forearm near the elbow x 2, Total = 2  Destruction of lesion - R volar forearm near the elbow x 2, Total = 2 Complexity: simple  Destruction method: cryotherapy   Informed consent: discussed and consent obtained   Timeout:  patient name, date of birth, surgical site, and procedure verified Lesion destroyed using liquid nitrogen: Yes   Region frozen until ice ball extended beyond lesion: Yes   Outcome: patient tolerated procedure well with no  complications   Post-procedure details: wound care instructions given    Neoplasm of skin L superior pubic  Epidermal / dermal shaving  Lesion diameter (cm):  0.6 Informed consent: discussed and consent obtained   Timeout: patient name, date of birth, surgical site, and procedure verified   Procedure prep:  Patient was prepped and draped in usual sterile fashion Prep type:  Isopropyl alcohol Anesthesia: the lesion was anesthetized in a standard fashion   Anesthetic:  1% lidocaine w/ epinephrine 1-100,000 buffered w/ 8.4% NaHCO3 Instrument used: flexible razor blade   Hemostasis achieved with: pressure, aluminum chloride and electrodesiccation   Outcome: patient tolerated procedure well   Post-procedure details: sterile dressing applied and wound care instructions given   Dressing type: bandage and bacitracin    Specimen 1 - Surgical pathology Differential Diagnosis: D48.5 Nevus vs Dysplastic Nevus  Check Margins: yes 0.6cm irregular brown macule  Elastosis of skin face  Recommend BBL to face first, $350 per txt session, then may consider Halo laser  No follow-ups on file.  I, Othelia Pulling, RMA, am acting as scribe for Sarina Ser, MD . Documentation: I have reviewed the above documentation for accuracy and completeness, and I agree with the above.  Sarina Ser, MD

## 2021-10-09 NOTE — Patient Instructions (Addendum)

## 2021-10-15 ENCOUNTER — Telehealth: Payer: Self-pay

## 2021-10-15 NOTE — Telephone Encounter (Signed)
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

## 2021-10-15 NOTE — Telephone Encounter (Signed)
Advised patient of results and scheduled surgery/hd

## 2021-10-21 ENCOUNTER — Encounter: Payer: Self-pay | Admitting: Dermatology

## 2021-10-21 ENCOUNTER — Other Ambulatory Visit: Payer: Self-pay

## 2021-10-21 ENCOUNTER — Ambulatory Visit: Payer: BC Managed Care – PPO | Admitting: Dermatology

## 2021-10-21 DIAGNOSIS — D280 Benign neoplasm of vulva: Secondary | ICD-10-CM | POA: Diagnosis not present

## 2021-10-21 DIAGNOSIS — D492 Neoplasm of unspecified behavior of bone, soft tissue, and skin: Secondary | ICD-10-CM

## 2021-10-21 DIAGNOSIS — D225 Melanocytic nevi of trunk: Secondary | ICD-10-CM | POA: Diagnosis not present

## 2021-10-21 MED ORDER — MUPIROCIN 2 % EX OINT: 1.0000 "application " | TOPICAL_OINTMENT | Freq: Every day | CUTANEOUS | 1 refills | Status: DC

## 2021-10-21 NOTE — Progress Notes (Signed)
   Follow-Up Visit   Subjective  Rachel Caldwell is a 50 y.o. female who presents for the following: Severe dysplastic nevus, bx proven (L superior pubic, pt presents for excision).  The following portions of the chart were reviewed this encounter and updated as appropriate:   Tobacco  Allergies  Meds  Problems  Med Hx  Surg Hx  Fam Hx     Review of Systems:  No other skin or systemic complaints except as noted in HPI or Assessment and Plan.  Objective  Well appearing patient in no apparent distress; mood and affect are within normal limits.  A focused examination was performed including pubic area. Relevant physical exam findings are noted in the Assessment and Plan.  L superior pubic Pink bx site 1.5 x 0.8cm   Assessment & Plan  Neoplasm of skin L superior pubic/ genital area  Skin excision  Lesion length (cm):  1.5 Lesion width (cm):  0.8 Margin per side (cm):  0.2 Total excision diameter (cm):  1.9 Informed consent: discussed and consent obtained   Timeout: patient name, date of birth, surgical site, and procedure verified   Procedure prep:  Patient was prepped and draped in usual sterile fashion Prep type:  Isopropyl alcohol and povidone-iodine Anesthesia: the lesion was anesthetized in a standard fashion   Anesthetic:  1% lidocaine w/ epinephrine 1-100,000 buffered w/ 8.4% NaHCO3 (4cc lido w/ epi, 3cc bupivicaine, Total 7cc) Instrument used: #15 blade   Hemostasis achieved with: pressure   Hemostasis achieved with comment:  Electrocautery Outcome: patient tolerated procedure well with no complications   Post-procedure details: sterile dressing applied and wound care instructions given   Dressing type: bandage, pressure dressing and bacitracin (Mupirocin)    Skin repair Complexity:  Complex Final length (cm):  3.5 Reason for type of repair: reduce tension to allow closure, reduce the risk of dehiscence, infection, and necrosis, reduce subcutaneous dead space  and avoid a hematoma, allow closure of the large defect, preserve normal anatomy, preserve normal anatomical and functional relationships and enhance both functionality and cosmetic results   Undermining: area extensively undermined   Undermining comment:  Undermining Defect 1.9cm Subcutaneous layers (deep stitches):  Suture size:  4-0 Suture type: Vicryl (polyglactin 910)   Subcutaneous suture technique: Inverted Dermal. Fine/surface layer approximation (top stitches):  Suture size:  4-0 Suture type: nylon   Stitches: simple running   Suture removal (days):  7 Hemostasis achieved with: pressure Outcome: patient tolerated procedure well with no complications   Post-procedure details: sterile dressing applied and wound care instructions given   Dressing type: bandage, pressure dressing and bacitracin (Mupirocin)    mupirocin ointment (BACTROBAN) 2 % Apply 1 application topically daily. Qd to excision site  Specimen 1 - Surgical pathology Differential Diagnosis: D48.5 Bx proven severe dysplastic nevus  Check Margins: yes Pink bx site 1.5 x 0.8cm [id number]  Severe bx proven  Start Mupirocin oint qd to excision site  Return in about 1 week (around 10/28/2021) for suture removal.  I, Othelia Pulling, RMA, am acting as scribe for Sarina Ser, MD . Documentation: I have reviewed the above documentation for accuracy and completeness, and I agree with the above.  Sarina Ser, MD

## 2021-10-21 NOTE — Patient Instructions (Signed)

## 2021-10-22 ENCOUNTER — Telehealth: Payer: Self-pay

## 2021-10-22 NOTE — Telephone Encounter (Signed)
Pt doing fine after yesterdays surgery./sh 

## 2021-10-24 ENCOUNTER — Encounter: Payer: Self-pay | Admitting: Dermatology

## 2021-10-28 ENCOUNTER — Encounter: Payer: Self-pay | Admitting: Dermatology

## 2021-10-28 ENCOUNTER — Other Ambulatory Visit: Payer: Self-pay

## 2021-10-28 ENCOUNTER — Ambulatory Visit (INDEPENDENT_AMBULATORY_CARE_PROVIDER_SITE_OTHER): Payer: BC Managed Care – PPO | Admitting: Dermatology

## 2021-10-28 DIAGNOSIS — Z86018 Personal history of other benign neoplasm: Secondary | ICD-10-CM

## 2021-10-28 DIAGNOSIS — Z4802 Encounter for removal of sutures: Secondary | ICD-10-CM

## 2021-10-28 NOTE — Progress Notes (Signed)
   Follow-Up Visit   Subjective  Rachel Caldwell is a 50 y.o. female who presents for the following: suture removal (Pathology proven margins free severely dysplastic nevus of the L superior pubic area).  The following portions of the chart were reviewed this encounter and updated as appropriate:   Tobacco  Allergies  Meds  Problems  Med Hx  Surg Hx  Fam Hx     Review of Systems:  No other skin or systemic complaints except as noted in HPI or Assessment and Plan.  Objective  Well appearing patient in no apparent distress; mood and affect are within normal limits.  A focused examination was performed including the pubic area. Relevant physical exam findings are noted in the Assessment and Plan.  L superior pubic Healing excision site.   Assessment & Plan  History of dysplastic nevus L superior pubic  Encounter for Removal of Sutures - Incision site at the L superior pubic is clean, dry and intact - Wound cleansed, sutures removed, wound cleansed and steri strips applied.  - Discussed pathology results showing margins free severely dysplastic nevus.   - Patient advised to keep steri-strips dry until they fall off. - Scars remodel for a full year. - Once steri-strips fall off, patient can apply over-the-counter silicone scar cream each night to help with scar remodeling if desired. - Patient advised to call with any concerns or if they notice any new or changing lesions.   Return in about 6 months (around 04/28/2022) for TBSE.  Luther Redo, CMA, am acting as scribe for Sarina Ser, MD . Documentation: I have reviewed the above documentation for accuracy and completeness, and I agree with the above.  Sarina Ser, MD

## 2021-10-28 NOTE — Patient Instructions (Signed)

## 2021-11-07 ENCOUNTER — Encounter: Payer: Self-pay | Admitting: Dermatology

## 2021-12-26 ENCOUNTER — Encounter: Payer: BC Managed Care – PPO | Admitting: Family Medicine

## 2022-02-06 ENCOUNTER — Other Ambulatory Visit: Payer: Self-pay | Admitting: Family Medicine

## 2022-02-06 DIAGNOSIS — R61 Generalized hyperhidrosis: Secondary | ICD-10-CM

## 2022-02-09 NOTE — Progress Notes (Signed)
Name: Rachel Caldwell   MRN: [id number]    DOB: 1971-10-07   Date:02/10/2022 ? ?     Progress Note ? ?Subjective ? ?Chief Complaint ? ?Annual Exam ? ?HPI ? ?Patient presents for annual CPE and follow up ? ?Anxiety: she states difficulty shutting down her brain, she is taking citalopram daily now and is doing well. No side effects of medication, Calms her brain  ?  ?Vasomotor symptoms: she has a hysterectomy for benign causes in her 30's over the past 6 years she has hot flashes and night sweats, she is doing better with clonidine qhs  ?  ?Recurrent UTI's : she was seeing Dr. Bernardo Heater , taking prn medication when she has intercourse and she also has antibiotics to take prn  ?  ?Asthma mild intermittent: she states episodes are mild and intermittent, when she has symptoms usually dry cough. Currently denies cough, wheezing or SOB ?  ?Basal cancer: seen by Dr. Nehemiah Massed, she a dysplastic nevus last year and is going back for follow up every 6 months now  ? ?Diet: she cooks at home during the week but eats out on weekend  ?Exercise: continue regular physical activity   ? ?[place] Office Visit from 12/25/2020 in [facility]  ?AUDIT-C Score 1  ? ?  ? ?Depression: Phq 9 is  negative ?Depression screen [facility] 2/9 02/10/2022 12/25/2020 08/15/2019 08/10/2018 02/07/2018  ?Decreased Interest 0 0 0 0 0  ?Down, Depressed, Hopeless 0 0 0 0 0  ?PHQ - 2 Score 0 0 0 0 0  ?Altered sleeping 0 - 0 0 1  ?Tired, decreased energy 0 - 0 0 0  ?Change in appetite 0 - 0 0 0  ?Feeling bad or failure about yourself  0 - 0 0 0  ?Trouble concentrating 0 - 0 0 1  ?Moving slowly or fidgety/restless 0 - 0 0 0  ?Suicidal thoughts 0 - 0 0 0  ?PHQ-9 Score 0 - 0 0 2  ?Difficult doing work/chores - - Not difficult at all - -  ? ?Hypertension: ?BP Readings from Last 3 Encounters:  ?02/10/22 116/72  ?02/07/21 (!) 137/91  ?12/25/20 120/80  ? ?Obesity: ?Wt Readings from Last 3 Encounters:  ?02/10/22 140 lb (63.5 kg)  ?02/07/21 138 lb (62.6 kg)   ?12/25/20 138 lb 3.2 oz (62.7 kg)  ? ?BMI Readings from Last 3 Encounters:  ?02/10/22 22.60 kg/m?  ?02/07/21 21.94 kg/m?  ?12/25/20 21.65 kg/m?  ?  ? ?Vaccines:  ? ?HPV: N/A ?Tdap: up to date ?Shingrix: she will check coverage with insurance  ?Pneumonia: up to date  ?Flu: [id number] at work  ?COVID-19: discussed bivalent vaccine ? ? ?Hep C Screening: 08/10/18 ?STD testing and prevention (HIV/chl/gon/syphilis): 12/25/20 ?Intimate partner violence: negative ?Sexual History :one partner, no pain  ?Menstrual History/LMP/Abnormal Bleeding: s/p hysterectomy , having hot flashes now  ?Discussed importance of follow up if any post-menopausal bleeding: not applicable ?Incontinence Symptoms: No. ? ?Breast cancer:  ?- Last Mammogram: 07/2021 ?- BRCA gene screening: N/A  ? ?Osteoporosis Prevention : Discussed high calcium and vitamin D supplementation, weight bearing exercises ?Bone density :not applicable ? ?Cervical cancer screening: N/A ? ?Skin cancer: Discussed monitoring for atypical lesions  ?Colorectal cancer: 02/07/21  - she is due for repeat since poor prep but worried about cost  ?Lung cancer:  Low Dose CT Chest recommended if Age 1-80 years, 20 pack-year currently smoking OR have quit w/in 15years. Patient does not qualify.   ?ECG: N/A ? ?Advanced  Care Planning: A voluntary discussion about advance care planning including the explanation and discussion of advance directives.  Discussed health care proxy and Living will, and the patient was able to identify a health care proxy as married .  Patient does not  have a living will or power of attorney of health care  ? ?Lipids: ?Lab Results  ?Component Value Date  ? CHOL 189 12/25/2020  ? CHOL 141 05/06/2017  ? CHOL 175 05/04/2016  ? ?Lab Results  ?Component Value Date  ? HDL 65 12/25/2020  ? HDL 58 05/06/2017  ? HDL 60 05/04/2016  ? ?Lab Results  ?Component Value Date  ? LDLCALC 103 (H) 12/25/2020  ? [place] 69 05/06/2017  ? [place] 85 05/04/2016  ? ?Lab Results  ?Component  Value Date  ? TRIG 114 12/25/2020  ? TRIG 68 05/06/2017  ? TRIG 152 (H) 05/04/2016  ? ?Lab Results  ?Component Value Date  ? CHOLHDL 2.9 12/25/2020  ? CHOLHDL 2.4 05/06/2017  ? CHOLHDL 2.9 05/04/2016  ? ?No results found for: LDLDIRECT ? ?Glucose: ?Glucose, Bld  ?Date Value Ref Range Status  ?12/25/2020 86 65 - 99 mg/dL Final  ?  Comment:  ?  . ?           Fasting reference interval ?. ?  ?02/28/2018 108 (H) 65 - 99 mg/dL Final  ?05/06/2017 89 65 - 99 mg/dL Final  ? ? ?Patient Active Problem List  ? Diagnosis Date Noted  ? Encounter for screening colonoscopy   ? Polyp of colon   ? H/O total hysterectomy 12/25/2020  ? History of basal cell cancer 12/25/2020  ? Mild intermittent asthma 05/11/2014  ? Generalized anxiety disorder 12/01/2007  ? 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  ? ? ?Past Surgical History:  ?Procedure Laterality Date  ? ABDOMINAL HYSTERECTOMY  2005  ? COLONOSCOPY WITH PROPOFOL N/A 02/07/2021  ? Procedure: COLONOSCOPY WITH PROPOFOL;  Surgeon: Virgel Manifold, MD;  Location: [facility] ENDOSCOPY;  Service: Endoscopy;  Laterality: N/A;  ? ? ?Family History  ?Problem Relation Age of Onset  ? Hypercholesterolemia Mother   ? Heart block Mother   ?     Put a Stent in  ? Hypertension Father   ? Breast cancer Neg Hx   ? ? ?Social History  ? ?Socioeconomic History  ? Marital status: Married  ?  Spouse name: Broadus John   ? Number of children: 0  ? Years of education: Not on file  ? Highest education level: 12th grade  ?Occupational History  ? Occupation: fund transfer   ?Tobacco Use  ? Smoking status: Former  ?  Packs/day: 0.50  ?  Years: 12.00  ?  Pack years: 6.00  ?  Types: Cigarettes  ?  Start date: 11/24/1999  ?  Quit date: 11/03/2012  ?  Years since quitting: 9.2  ? Smokeless tobacco: Never  ?Vaping Use  ? Vaping Use: Never used  ?Substance and Sexual Activity  ? Alcohol use: No  ?  Alcohol/week: 0.0 standard drinks  ? Drug use: No  ? Sexual activity: Yes  ?  Partners: Male  ?  Birth control/protection: Surgical  ?Other Topics  Concern  ? Not on file  ?Social History Narrative  ? Married, never had children secondary to gyn problems and two miscarriages, has one dog   ? ?Social Determinants of Health  ? ?Financial Resource Strain: Low Risk   ? Difficulty of Paying Living Expenses: Not hard at all  ?Food Insecurity: No Food Insecurity  ?  Worried About Charity fundraiser in the Last Year: Never true  ? Ran Out of Food in the Last Year: Never true  ?Transportation Needs: No Transportation Needs  ? Lack of Transportation (Medical): No  ? Lack of Transportation (Non-Medical): No  ?Physical Activity: Sufficiently Active  ? Days of Exercise per Week: 5 days  ? Minutes of Exercise per Session: 30 min  ?Stress: No Stress Concern Present  ? Feeling of Stress : Only a little  ?Social Connections: Moderately Integrated  ? Frequency of Communication with Friends and Family: More than three times a week  ? Frequency of Social Gatherings with Friends and Family: Once a week  ? Attends Religious Services: More than 4 times per year  ? Active Member of Clubs or Organizations: No  ? Attends Archivist Meetings: Never  ? Marital Status: Married  ?Intimate Partner Violence: Not At Risk  ? Fear of Current or Ex-Partner: No  ? Emotionally Abused: No  ? Physically Abused: No  ? Sexually Abused: No  ? ? ? ?Current Outpatient Medications:  ?  albuterol (VENTOLIN HFA) 108 (90 Base) MCG/ACT inhaler, INHALE 2 PUFFS INTO THE LUNGS EVERY 6 HOURS AS NEEDED FOR WHEEZING OR SHORTNESS OF BREATH, Disp: 18 g, Rfl: 1 ?  citalopram (CELEXA) 20 MG tablet, TAKE 1 TABLET DAILY, Disp: 90 tablet, Rfl: 1 ?  cloNIDine (CATAPRES) 0.1 MG tablet, TAKE 1 TABLET AT BEDTIME, Disp: 90 tablet, Rfl: 0 ?  doxycycline (ORACEA) 40 MG capsule, TAKE ONE CAPSULE BY MOUTH EVERY DAY WITH FOOD AND WATER, Disp: , Rfl:  ?  EPIDUO FORTE 0.3-2.5 % GEL, APPLY thin layer TO FACE AT bedtime; Twilight off IN THE MORNING], Disp: , Rfl:  ?  nitrofurantoin, macrocrystal-monohydrate, (MACROBID) 100 MG  capsule, 1 capsule twice daily for 5 days at first onset of UTI symptoms and prn intercourse, Disp: 90 capsule, Rfl: 0 ?  fluconazole (DIFLUCAN) 150 MG tablet, Take 1 tablet (150 mg total) by mouth every other

## 2022-02-09 NOTE — Patient Instructions (Signed)

## 2022-02-10 ENCOUNTER — Encounter: Payer: Self-pay | Admitting: Family Medicine

## 2022-02-10 ENCOUNTER — Ambulatory Visit (INDEPENDENT_AMBULATORY_CARE_PROVIDER_SITE_OTHER): Payer: BC Managed Care – PPO | Admitting: Family Medicine

## 2022-02-10 VITALS — BP 116/72 | HR 92 | Resp 16 | Ht 66.0 in | Wt 140.0 lb

## 2022-02-10 DIAGNOSIS — Z1231 Encounter for screening mammogram for malignant neoplasm of breast: Secondary | ICD-10-CM | POA: Diagnosis not present

## 2022-02-10 DIAGNOSIS — Z85828 Personal history of other malignant neoplasm of skin: Secondary | ICD-10-CM | POA: Diagnosis not present

## 2022-02-10 DIAGNOSIS — Z1211 Encounter for screening for malignant neoplasm of colon: Secondary | ICD-10-CM | POA: Diagnosis not present

## 2022-02-10 DIAGNOSIS — N390 Urinary tract infection, site not specified: Secondary | ICD-10-CM

## 2022-02-10 DIAGNOSIS — Z90710 Acquired absence of both cervix and uterus: Secondary | ICD-10-CM

## 2022-02-10 DIAGNOSIS — R61 Generalized hyperhidrosis: Secondary | ICD-10-CM

## 2022-02-10 DIAGNOSIS — Z0000 Encounter for general adult medical examination without abnormal findings: Secondary | ICD-10-CM | POA: Diagnosis not present

## 2022-02-10 DIAGNOSIS — F411 Generalized anxiety disorder: Secondary | ICD-10-CM | POA: Diagnosis not present

## 2022-02-10 MED ORDER — CLONIDINE HCL 0.1 MG PO TABS: 0.1000 mg | ORAL_TABLET | Freq: Every day | ORAL | 3 refills | Status: AC

## 2022-02-10 MED ORDER — CITALOPRAM HYDROBROMIDE 20 MG PO TABS: 20.0000 mg | ORAL_TABLET | Freq: Every day | ORAL | 3 refills | Status: AC

## 2022-02-12 ENCOUNTER — Telehealth: Payer: Self-pay

## 2022-02-12 ENCOUNTER — Encounter: Payer: Self-pay | Admitting: Family Medicine

## 2022-02-12 NOTE — Telephone Encounter (Signed)
Pt called having constipation recommended miralax and to schedule an appt.  She declined at this time but told her if that did not help she would need an appt to discuss.  She states has not had bowel movement in a couple of weeks and she states miralax has not helped in past.  Told her she will probably have to take daily.  But also told her if severely constipated could do otc laxative. ?

## 2022-04-29 ENCOUNTER — Ambulatory Visit: Payer: BC Managed Care – PPO | Admitting: Dermatology

## 2022-05-04 ENCOUNTER — Encounter: Payer: Self-pay | Admitting: Dermatology

## 2022-05-04 ENCOUNTER — Ambulatory Visit: Payer: BC Managed Care – PPO | Admitting: Dermatology

## 2022-05-04 DIAGNOSIS — L578 Other skin changes due to chronic exposure to nonionizing radiation: Secondary | ICD-10-CM

## 2022-05-04 DIAGNOSIS — Z1283 Encounter for screening for malignant neoplasm of skin: Secondary | ICD-10-CM

## 2022-05-04 DIAGNOSIS — L821 Other seborrheic keratosis: Secondary | ICD-10-CM

## 2022-05-04 DIAGNOSIS — D1800 Hemangioma unspecified site: Secondary | ICD-10-CM

## 2022-05-04 DIAGNOSIS — L814 Other melanin hyperpigmentation: Secondary | ICD-10-CM | POA: Diagnosis not present

## 2022-05-04 DIAGNOSIS — L820 Inflamed seborrheic keratosis: Secondary | ICD-10-CM | POA: Diagnosis not present

## 2022-05-04 DIAGNOSIS — Z86018 Personal history of other benign neoplasm: Secondary | ICD-10-CM

## 2022-05-04 DIAGNOSIS — Z85828 Personal history of other malignant neoplasm of skin: Secondary | ICD-10-CM | POA: Diagnosis not present

## 2022-05-04 DIAGNOSIS — D229 Melanocytic nevi, unspecified: Secondary | ICD-10-CM

## 2022-05-04 NOTE — Patient Instructions (Signed)
Cryotherapy Aftercare  Wash gently with soap and water everyday.   Apply Vaseline daily until healed.   Prior to procedure, discussed risks of blister formation, small wound, skin dyspigmentation, or rare scar following cryotherapy. Recommend Vaseline ointment to treated areas while healing.   Melanoma ABCDEs  Melanoma is the most dangerous type of skin cancer, and is the leading cause of death from skin disease.  You are more likely to develop melanoma if you: Have light-colored skin, light-colored eyes, or red or blond hair Spend a lot of time in the sun Tan regularly, either outdoors or in a tanning bed Have had blistering sunburns, especially during childhood Have a close family member who has had a melanoma Have atypical moles or large birthmarks  Early detection of melanoma is key since treatment is typically straightforward and cure rates are extremely high if we catch it early.   The first sign of melanoma is often a change in a mole or a new dark spot.  The ABCDE system is a way of remembering the signs of melanoma.  A for asymmetry:  The two halves do not match. B for border:  The edges of the growth are irregular. C for color:  A mixture of colors are present instead of an even brown color. D for diameter:  Melanomas are usually (but not always) greater than 73m - the size of a pencil eraser. E for evolution:  The spot keeps changing in size, shape, and color.  Please check your skin once per month between visits. You can use a small mirror in front and a large mirror behind you to keep an eye on the back side or your body.   If you see any new or changing lesions before your next follow-up, please call to schedule a visit.  Please continue daily skin protection including broad spectrum sunscreen SPF 30+ to sun-exposed areas, reapplying every 2 hours as needed when you're outdoors.   Staying in the shade or wearing long sleeves, sun glasses (UVA+UVB protection) and wide  brim hats (4-inch brim around the entire circumference of the hat) are also recommended for sun protection.     Due to recent changes in healthcare laws, you may see results of your pathology and/or laboratory studies on MyChart before the doctors have had a chance to review them. We understand that in some cases there may be results that are confusing or concerning to you. Please understand that not all results are received at the same time and often the doctors may need to interpret multiple results in order to provide you with the best plan of care or course of treatment. Therefore, we ask that you please give u[place]2 business days to thoroughly review all your results before contacting the office for clarification. Should we see a critical lab result, you will be contacted sooner.   If You Need Anything After Your Visit  If you have any questions or concerns for your doctor, please call our main line at [phone] press option 4 to reach your doctor's medical assistant. If no one answers, please leave a voicemail as directed and we will return your call as soon as possible. Messages left after 4 pm will be answered the following business day.   You may also send [place] message via [employer] We typically respond to MyChart messages within 1-2 business days.  For prescription refills, please ask your pharmacy to contact our office. Our fax number is [phone]  If you have  an urgent issue when the clinic is closed that cannot wait until the next business day, you can page your doctor at the number below.    Please note that while we do our best to be available for urgent issues outside of office hours, we are not available 24/7.   If you have an urgent issue and are unable to reach [place], you may choose to seek medical care at your doctor's office, retail clinic, urgent care center, or emergency room.  If you have a medical emergency, please immediately call 911 or go to the emergency  department.  Pager Numbers  - Dr. Nehemiah Massed: [phone]  - Dr. Laurence Ferrari: [phone]  - Dr. Nicole Kindred: [phone]  In the event of inclement weather, please call our main line at [phone] for an update on the status of any delays or closures.  Dermatology Medication Tips: Please keep the boxes that topical medications come in in order to help keep track of the instructions about where and how to use these. Pharmacies typically print the medication instructions only on the boxes and not directly on the medication tubes.   If your medication is too expensive, please contact our office at [phone] option 4 or send [place] a message through [place].   We are unable to tell what your co-pay for medications will be in advance as this is different depending on your insurance coverage. However, we may be able to find a substitute medication at lower cost or fill out paperwork to get insurance to cover a needed medication.   If a prior authorization is required to get your medication covered by your insurance company, please allow [place] 1-2 business days to complete this process.  Drug prices often vary depending on where the prescription is filled and some pharmacies may offer cheaper prices.  The website www.goodrx.com contains coupons for medications through different pharmacies. The prices here do not account for what the cost may be with help from insurance (it may be cheaper with your insurance), but the website can give you the price if you did not use any insurance.  - You can print the associated coupon and take it with your prescription to the pharmacy.  - You may also stop by our office during regular business hours and pick up a GoodRx coupon card.  - If you need your prescription sent electronically to a different pharmacy, notify our office through [facility] or by phone at [phone] option 4.     Si Usted Necesita Algo Despus de Su Visita  Tambin puede enviarnos un  mensaje a travs de Pharmacist, community. Por lo general respondemos a los mensajes de MyChart en el transcurso de 1 a 2 das hbiles.  Para renovar recetas, por favor pida a su farmacia que se ponga en contacto con nuestra oficina. Harland Dingwall de fax es [place] [phone].  Si tiene un asunto urgente cuando la clnica est cerrada y que no puede esperar hasta el siguiente da hbil, puede llamar/localizar a su doctor(a) al nmero que aparece a continuacin.   Por favor, tenga en cuenta que aunque hacemos todo lo posible para estar disponibles para asuntos urgentes fuera del horario de [place], no estamos disponibles las 24 horas del da, los 7 das de la [place].   Si tiene un problema urgente y no puede comunicarse con nosotros, puede optar por buscar atencin mdica  en el consultorio de su doctor(a), en una clnica privada, en un centro de atencin urgente o en una sala de  emergencias.  Si tiene Engineering geologist, por favor llame inmediatamente al 911 o vaya a la sala de emergencias.  Nmeros de bper  - Dr. Nehemiah Massed: [phone]  - Dra. Moye: [phone]  - Dra. Nicole Kindred: [phone]  En caso de inclemencias del Superior, por favor llame a Johnsie Kindred principal al [phone] para una actualizacin sobre el [place] de cualquier retraso o cierre.  Consejos para la medicacin en dermatologa: Por favor, guarde las cajas en las que vienen los medicamentos de uso tpico para ayudarle a seguir las instrucciones sobre dnde y cmo usarlos. Las farmacias generalmente imprimen las instrucciones del medicamento slo en las cajas y no directamente en los tubos del [place].   Si su medicamento es muy caro, por favor, pngase en contacto con Zigmund Daniel llamando al [phone] y presione la opcin 4 o envenos un mensaje a travs de Pharmacist, community.   No podemos decirle cul ser su copago por los medicamentos por adelantado ya que esto es diferente dependiendo de la cobertura de su seguro. Sin embargo,  es posible que podamos encontrar un medicamento sustituto a Electrical engineer un formulario para que el seguro cubra el medicamento que se considera necesario.   Si se requiere una autorizacin previa para que su compaa de seguros [place] su medicamento, por favor permtanos de 1 a 2 das hbiles para completar este proceso.  Los precios de los medicamentos varan con frecuencia dependiendo del Environmental consultant de dnde se surte la receta y alguna farmacias pueden ofrecer precios ms baratos.  El sitio web www.goodrx.com tiene cupones para medicamentos de Airline pilot. Los precios aqu no tienen en cuenta lo que podra costar con la ayuda del seguro (puede ser ms barato con su seguro), pero el sitio web puede darle el precio si no utiliz Research scientist (physical sciences).  - Puede imprimir el cupn correspondiente y llevarlo con su receta a la farmacia.  - Tambin puede pasar por nuestra oficina durante el horario de atencin regular y Charity fundraiser una tarjeta de cupones de GoodRx.  - Si necesita que su receta se enve electrnicamente a una farmacia diferente, informe a nuestra oficina a travs de MyChart de [place] o por telfono llamando al [phone] y presione la opcin 4.

## 2022-05-04 NOTE — Progress Notes (Signed)
Follow-Up Visit   Subjective  Rachel Caldwell is a 51 y.o. female who presents for the following: Annual Exam (Skin cancer screening. Full body. Hx of BCC's, Hx of dysplastic nevi). The patient presents for Total-Body Skin Exam (TBSE) for skin cancer screening and mole check.  The patient has spots, moles and lesions to be evaluated, some may be new or changing and the patient has concerns that these could be cancer.  The following portions of the chart were reviewed this encounter and updated as appropriate:  Tobacco  Allergies  Meds  Problems  Med Hx  Surg Hx  Fam Hx     Review of Systems: No other skin or systemic complaints except as noted in HPI or Assessment and Plan.  Objective  Well appearing patient in no apparent distress; mood and affect are within normal limits.  A full examination was performed including scalp, head, eyes, ears, nose, lips, neck, chest, axillae, abdomen, back, buttocks, bilateral upper extremities, bilateral lower extremities, hands, feet, fingers, toes, fingernails, and toenails. All findings within normal limits unless otherwise noted below.  Left lateral supraclavicular x1 Erythematous keratotic or waxy stuck-on papule or plaque.   Assessment & Plan   History of Basal Cell Carcinoma of the Skin. Left mid to med pretibial medial, 06/03/2020. Right post thigh, superficial, 07/23/2020 - No evidence of recurrence today - Recommend regular full body skin exams - Recommend daily broad spectrum sunscreen SPF 30+ to sun-exposed areas, reapply every 2 hours as needed.  - Call if any new or changing lesions are noted between office visits   History of Dysplastic Nevi. Left post thigh, mod, 2021. R mid to lower back 2.0cm lat to spine, severe atypia, exc 04/08/21.  Left sup pubic, severe atypia - excised 10/21/21. - No evidence of recurrence today - Recommend regular full body skin exams - Recommend daily broad spectrum sunscreen SPF 30+ to sun-exposed  areas, reapply every 2 hours as needed.  - Call if any new or changing lesions are noted between office visits   Inflamed seborrheic keratosis Left lateral supraclavicular x1  Symptomatic, irritating, patient would like treated.  Destruction of lesion - Left lateral supraclavicular x1 Complexity: simple   Destruction method: cryotherapy   Informed consent: discussed and consent obtained   Timeout:  patient name, date of birth, surgical site, and procedure verified Lesion destroyed using liquid nitrogen: Yes   Region frozen until ice ball extended beyond lesion: Yes   Outcome: patient tolerated procedure well with no complications   Post-procedure details: wound care instructions given   Additional details:  Prior to procedure, discussed risks of blister formation, small wound, skin dyspigmentation, or rare scar following cryotherapy. Recommend Vaseline ointment to treated areas while healing.   Lentigines - Scattered tan macules - Due to sun exposure - Benign-appearing, observe - Recommend daily broad spectrum sunscreen SPF 30+ to sun-exposed areas, reapply every 2 hours as needed. - Call for any changes  Seborrheic Keratoses - Stuck-on, waxy, tan-brown papules and/or plaques  - Benign-appearing - Discussed benign etiology and prognosis. - Observe - Call for any changes  Melanocytic Nevi - Tan-brown and/or pink-flesh-colored symmetric macules and papules - Benign appearing on exam today - Observation - Call clinic for new or changing moles - Recommend daily use of broad spectrum spf 30+ sunscreen to sun-exposed areas.   Hemangiomas - Red papules - Discussed benign nature - Observe - Call for any changes  Actinic Damage - Chronic condition, secondary to cumulative UV/sun exposure -  diffuse scaly erythematous macules with underlying dyspigmentation - Recommend daily broad spectrum sunscreen SPF 30+ to sun-exposed areas, reapply every 2 hours as needed.  - Staying in  the shade or wearing long sleeves, sun glasses (UVA+UVB protection) and wide brim hats (4-inch brim around the entire circumference of the hat) are also recommended for sun protection.  - Call for new or changing lesions.  Skin cancer screening performed today.  Return in about 6 months (around 11/03/2022) for TBSE, HxBCC's, HxDN.  I, Emelia Salisbury, CMA, am acting as scribe for Sarina Ser, MD. Documentation: I have reviewed the above documentation for accuracy and completeness, and I agree with the above.  Sarina Ser, MD

## 2022-05-05 ENCOUNTER — Encounter: Payer: Self-pay | Admitting: Dermatology

## 2022-06-07 ENCOUNTER — Other Ambulatory Visit: Payer: Self-pay | Admitting: Family Medicine

## 2022-06-07 DIAGNOSIS — N390 Urinary tract infection, site not specified: Secondary | ICD-10-CM

## 2022-06-08 MED ORDER — NITROFURANTOIN MONOHYD MACRO 100 MG PO CAPS: ORAL_CAPSULE | ORAL | 0 refills | Status: DC

## 2022-08-27 ENCOUNTER — Ambulatory Visit
Admission: RE | Admit: 2022-08-27 | Discharge: 2022-08-27 | Disposition: A | Discharge status: Home / Self Care | Payer: BC Managed Care – PPO | Source: Ambulatory Visit | Attending: Family Medicine | Admitting: Family Medicine

## 2022-08-27 DIAGNOSIS — Z1231 Encounter for screening mammogram for malignant neoplasm of breast: Secondary | ICD-10-CM | POA: Diagnosis not present

## 2022-10-19 ENCOUNTER — Ambulatory Visit: Payer: BC Managed Care – PPO | Admitting: Dermatology

## 2022-10-19 ENCOUNTER — Other Ambulatory Visit: Payer: Self-pay | Admitting: Family Medicine

## 2022-10-19 DIAGNOSIS — N390 Urinary tract infection, site not specified: Secondary | ICD-10-CM

## 2022-10-20 NOTE — Telephone Encounter (Signed)
Appt sch'd for 12.4.2023. she is not completely out and think she may have enough to last until her appt but is not sure.

## 2022-10-21 ENCOUNTER — Ambulatory Visit: Payer: BC Managed Care – PPO | Admitting: Dermatology

## 2022-10-23 NOTE — Progress Notes (Signed)
Name: Rachel Caldwell   MRN: [id number]    DOB: 1971-11-15   Date:10/26/2022       Progress Note  Subjective  Chief Complaint  Medication Refill  I connected with  Rachel Caldwell  on 10/26/22 at  2:20 PM EST by a video enabled telemedicine application and verified that I am speaking with the correct person using two identifiers.  I discussed the limitations of evaluation and management by telemedicine and the availability of in person appointments. The patient expressed understanding and agreed to proceed with the virtual visit  Staff also discussed with the patient that there may be a patient responsible charge related to this service. Patient Location: [facility] Provider Location: home - waiting COVID-19 test results Additional Individuals present: alone  HPI  Anxiety: she states difficulty shutting down her brain, she is taking citalopram daily now and is doing well. No side effects of medication. She does not need refills today    Vasomotor symptoms: she has a hysterectomy for benign causes in her 30's over the past 6 years she has hot flashes and night sweats, she is doing better with clonidine qhs . We will send refill in March    Recurrent UTI's : she was seeing Dr. Lonna Cobb , taking prn medication when she has intercourse and she also has antibiotics to take prn . She has used 90 pills of Macrobid in the past 6 months, that is more than one episode per month. She states symptoms consists of urinary frequency. No hematuria or fever. Discussed switching to 100 mg macrobid daily and take septra for 3 days only prn episodes, also advised to bring urine specimen for culture during her next UTI episodes to make sure it is an infection    Asthma mild intermittent: she states episodes are mild and intermittent, when she has symptoms usually dry cough. Currently denies cough, wheezing or SOB. She does not need refills today, had flu shot at work     Patient Active Problem List   Diagnosis Date  Noted   Encounter for screening colonoscopy    Polyp of colon    H/O total hysterectomy 12/25/2020   History of basal cell cancer 12/25/2020   Mild intermittent asthma 05/11/2014   Generalized anxiety disorder 12/01/2007   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    Past Surgical History:  Procedure Laterality Date   ABDOMINAL HYSTERECTOMY  2005   COLONOSCOPY WITH PROPOFOL N/A 02/07/2021   Procedure: COLONOSCOPY WITH PROPOFOL;  Surgeon: Pasty Spillers, MD;  Location: [facility] END[facility];  Service: Endoscopy;  Laterality: N/A;    Family History  Problem Relation Age of Onset   Hypercholesterolemia Mother    Heart block Mother        Put a Stent in   Hypertension Father    Breast cancer Neg Hx     Social History   Socioeconomic History   Marital status: Married    Spouse name: Rachel Caldwell    Number of children: 0   Years of education: Not on file   Highest education level: 12th grade  Occupational History   Occupation: fund transfer   Tobacco Use   Smoking status: Former    Packs/day: 0.50    Years: 12.00    Total pack years: 6.00    Types: Cigarettes    Start date: 11/24/1999    Quit date: 11/03/2012    Years since quitting: 9.9   Smokeless tobacco: Never  Vaping Use   Vaping Use: Never used  Substance  and Sexual Activity   Alcohol use: No    Alcohol/week: 0.0 standard drinks of alcohol   Drug use: No   Sexual activity: Yes    Partners: Male    Birth control/protection: Surgical  Other Topics Concern   Not on file  Social History Narrative   Married, never had children secondary to gyn problems and two miscarriages, has one dog    Social Determinants of Health   Financial Resource Strain: Low Risk  (02/10/2022)   Overall Financial Resource Strain (CARDIA)    Difficulty of Paying Living Expenses: Not hard at all  Food Insecurity: No Food Insecurity (02/10/2022)   Hunger Vital Sign    Worried About Running Out of Food in the Last Year: Never true    Ran Out of Food in the Last  Year: Never true  Transportation Needs: No Transportation Needs (02/10/2022)   PRAPARE - Administrator, Civil Service (Medical): No    Lack of Transportation (Non-Medical): No  Physical Activity: Sufficiently Active (02/10/2022)   Exercise Vital Sign    Days of Exercise per Week: 5 days    Minutes of Exercise per Session: 30 min  Stress: No Stress Concern Present (02/10/2022)   [employer] of Occupational Health - Occupational Stress Questionnaire    Feeling of Stress : Only a little  Social Connections: Moderately Integrated (02/10/2022)   Social Connection and Isolation Panel [NHANES]    Frequency of Communication with Friends and Family: More than three times a week    Frequency of Social Gatherings with Friends and Family: Once a week    Attends Religious Services: More than 4 times per year    Active Member of [employer] or Organizations: No    Attends Banker Meetings: Never    Marital Status: Married  Catering manager Violence: Not At Risk (02/10/2022)   Humiliation, Afraid, Rape, and Kick questionnaire    Fear of Current or Ex-Partner: No    Emotionally Abused: No    Physically Abused: No    Sexually Abused: No     Current Outpatient Medications:    albuterol (VENTOLIN HFA) 108 (90 Base) MCG/ACT inhaler, INHALE 2 PUFFS INTO THE LUNGS EVERY 6 HOURS AS NEEDED FOR WHEEZING OR SHORTNESS OF BREATH, Disp: 18 g, Rfl: 1   citalopram (CELEXA) 20 MG tablet, Take 1 tablet (20 mg total) by mouth daily., Disp: 90 tablet, Rfl: 3   cloNIDine (CATAPRES) 0.1 MG tablet, Take 1 tablet (0.1 mg total) by mouth at bedtime., Disp: 90 tablet, Rfl: 3   doxycycline (ORACEA) 40 MG capsule, TAKE ONE CAPSULE BY MOUTH EVERY DAY WITH FOOD AND WATER, Disp: , Rfl:    EPIDUO FORTE 0.3-2.5 % GEL, APPLY thin layer TO FACE AT bedtime; WASH off IN THE MORNING], Disp: , Rfl:    nitrofurantoin, macrocrystal-monohydrate, (MACROBID) 100 MG capsule, 1 capsule twice daily for 5 days at first  onset of UTI symptoms and prn intercourse, Disp: 90 capsule, Rfl: 0  No Known Allergies  I personally reviewed active problem list, medication list, allergies, family history, social history, health maintenance with the patient/caregiver today.   ROS  Ten systems reviewed and is negative except as mentioned in HPI  Objective  Today's Vitals   10/26/22 1411  BP: 124/70  Pulse: 79  Resp: 16  SpO2: 97%  Weight: 130 lb (59 kg)  Height: 5\' 6"  (1.676 m)   Body mass index is 20.98 kg/m.     Physical Exam  Awake,  alert and oriented, no distress   PHQ2/9:    10/26/2022    2:12 PM 02/10/2022    3:28 PM 12/25/2020    1:42 PM 08/15/2019    3:51 PM 08/10/2018    3:25 PM  Depression screen PHQ 2/9  Decreased Interest 0 0 0 0 0  Down, Depressed, Hopeless 0 0 0 0 0  PHQ - 2 Score 0 0 0 0 0  Altered sleeping 0 0  0 0  Tired, decreased energy 0 0  0 0  Change in appetite 0 0  0 0  Feeling bad or failure about yourself  0 0  0 0  Trouble concentrating 0 0  0 0  Moving slowly or fidgety/restless 0 0  0 0  Suicidal thoughts 0 0  0 0  PHQ-9 Score 0 0  0 0  Difficult doing work/chores    Not difficult at all    PHQ-2/9 Result is negative.    Fall Risk:    10/26/2022    2:12 PM 02/10/2022    3:28 PM 12/25/2020    1:42 PM 08/15/2019    3:51 PM 02/07/2018    3:00 PM  Fall Risk   Falls in the past year? 0 0 0 0 No  Number falls in past yr: 0 0 0 0   Injury with Fall? 0 0 0 0   Risk for fall due to : No Fall Risks No Fall Risks     Follow up Falls prevention discussed Falls prevention discussed        Assessment & Plan   1. Recurrent UTI  - nitrofurantoin, macrocrystal-monohydrate, (MACROBID) 100 MG capsule; Take 1 capsule (100 mg total) by mouth daily.  Dispense: 90 capsule; Refill: 1 - sulfamethoxazole-trimethoprim (BACTRIM) 400-80 MG tablet; Take 1 tablet by mouth 2 (two) times daily. Prn UTI  Dispense: 90 tablet; Refill: 0 - CULTURE, URINE COMPREHENSIVE  2. Mild  intermittent asthma without complication   3. Generalized anxiety disorder  Stable  4. Night sweats  Doing well on medication  5. Colon cancer screening  Due for repeat, but refused    I discussed the assessment and treatment plan with the patient. The patient was provided an opportunity to ask questions and all were answered. The patient agreed with the plan and demonstrated an understanding of the instructions.  The patient was advised to call back or seek an in-person evaluation if the symptoms worsen or if the condition fails to improve as anticipated.  I provided 25 minutes of non-face-to-face time during this encounter.

## 2022-10-26 ENCOUNTER — Telehealth: Payer: BC Managed Care – PPO | Admitting: Family Medicine

## 2022-10-26 ENCOUNTER — Encounter: Payer: Self-pay | Admitting: Family Medicine

## 2022-10-26 VITALS — BP 124/70 | HR 79 | Resp 16 | Ht 66.0 in | Wt 130.0 lb

## 2022-10-26 DIAGNOSIS — Z1211 Encounter for screening for malignant neoplasm of colon: Secondary | ICD-10-CM

## 2022-10-26 DIAGNOSIS — J4520 Mild intermittent asthma, uncomplicated: Secondary | ICD-10-CM

## 2022-10-26 DIAGNOSIS — N390 Urinary tract infection, site not specified: Secondary | ICD-10-CM

## 2022-10-26 DIAGNOSIS — R61 Generalized hyperhidrosis: Secondary | ICD-10-CM | POA: Diagnosis not present

## 2022-10-26 DIAGNOSIS — F411 Generalized anxiety disorder: Secondary | ICD-10-CM | POA: Diagnosis not present

## 2022-10-26 MED ORDER — SULFAMETHOXAZOLE-TRIMETHOPRIM 400-80 MG PO TABS: 1.0000 | ORAL_TABLET | Freq: Two times a day (BID) | ORAL | 0 refills | Status: AC

## 2022-10-26 MED ORDER — NITROFURANTOIN MONOHYD MACRO 100 MG PO CAPS: 100.0000 mg | ORAL_CAPSULE | Freq: Every day | ORAL | 1 refills | Status: AC

## 2022-10-26 NOTE — Patient Instructions (Signed)
Macrobid daily Septra twice daily for 3 days pre UTI

## 2022-11-05 ENCOUNTER — Ambulatory Visit: Payer: BC Managed Care – PPO | Admitting: Dermatology

## 2022-11-05 VITALS — BP 123/79 | HR 79

## 2022-11-05 DIAGNOSIS — Z1283 Encounter for screening for malignant neoplasm of skin: Secondary | ICD-10-CM

## 2022-11-05 DIAGNOSIS — Z86018 Personal history of other benign neoplasm: Secondary | ICD-10-CM

## 2022-11-05 DIAGNOSIS — L814 Other melanin hyperpigmentation: Secondary | ICD-10-CM

## 2022-11-05 DIAGNOSIS — L570 Actinic keratosis: Secondary | ICD-10-CM | POA: Diagnosis not present

## 2022-11-05 DIAGNOSIS — Z79899 Other long term (current) drug therapy: Secondary | ICD-10-CM

## 2022-11-05 DIAGNOSIS — L578 Other skin changes due to chronic exposure to nonionizing radiation: Secondary | ICD-10-CM

## 2022-11-05 DIAGNOSIS — L309 Dermatitis, unspecified: Secondary | ICD-10-CM

## 2022-11-05 DIAGNOSIS — Z85828 Personal history of other malignant neoplasm of skin: Secondary | ICD-10-CM | POA: Diagnosis not present

## 2022-11-05 DIAGNOSIS — L821 Other seborrheic keratosis: Secondary | ICD-10-CM

## 2022-11-05 DIAGNOSIS — L820 Inflamed seborrheic keratosis: Secondary | ICD-10-CM

## 2022-11-05 DIAGNOSIS — D229 Melanocytic nevi, unspecified: Secondary | ICD-10-CM

## 2022-11-05 DIAGNOSIS — I781 Nevus, non-neoplastic: Secondary | ICD-10-CM

## 2022-11-05 NOTE — Patient Instructions (Addendum)
For inflammation at left breast can use over the counter hydrocortisone 1 % cream to affected areas as needed for irritation       Actinic keratoses are precancerous spots that appear secondary to cumulative UV radiation exposure/sun exposure over time. They are chronic with expected duration over 1 year. A portion of actinic keratoses will progress to squamous cell carcinoma of the skin. It is not possible to reliably predict which spots will progress to skin cancer and so treatment is recommended to prevent development of skin cancer.  Recommend daily broad spectrum sunscreen SPF 30+ to sun-exposed areas, reapply every 2 hours as needed.  Recommend staying in the shade or wearing long sleeves, sun glasses (UVA+UVB protection) and wide brim hats (4-inch brim around the entire circumference of the hat). Call for new or changing lesions.     Cryotherapy Aftercare  Wash gently with soap and water everyday.   Apply Vaseline and Band-Aid daily until healed.    Seborrheic Keratosis  What causes seborrheic keratoses? Seborrheic keratoses are harmless, common skin growths that first appear during adult life.  As time goes by, more growths appear.  Some people may develop a large number of them.  Seborrheic keratoses appear on both covered and uncovered body parts.  They are not caused by sunlight.  The tendency to develop seborrheic keratoses can be inherited.  They vary in color from skin-colored to gray, brown, or even black.  They can be either smooth or have a rough, warty surface.   Seborrheic keratoses are superficial and look as if they were stuck on the skin.  Under the microscope this type of keratosis looks like layers upon layers of skin.  That is why at times the top layer may seem to fall off, but the rest of the growth remains and re-grows.    Treatment Seborrheic keratoses do not need to be treated, but can easily be removed in the office.  Seborrheic keratoses often cause  symptoms when they rub on clothing or jewelry.  Lesions can be in the way of shaving.  If they become inflamed, they can cause itching, soreness, or burning.  Removal of a seborrheic keratosis can be accomplished by freezing, burning, or surgery. If any spot bleeds, scabs, or grows rapidly, please return to have it checked, as these can be an indication of a skin cancer.      Melanoma ABCDEs  Melanoma is the most dangerous type of skin cancer, and is the leading cause of death from skin disease.  You are more likely to develop melanoma if you: Have light-colored skin, light-colored eyes, or red or blond hair Spend a lot of time in the sun Tan regularly, either outdoors or in a tanning bed Have had blistering sunburns, especially during childhood Have a close family member who has had a melanoma Have atypical moles or large birthmarks  Early detection of melanoma is key since treatment is typically straightforward and cure rates are extremely high if we catch it early.   The first sign of melanoma is often a change in a mole or a new dark spot.  The ABCDE system is a way of remembering the signs of melanoma.  A for asymmetry:  The two halves do not match. B for border:  The edges of the growth are irregular. C for color:  A mixture of colors are present instead of an even brown color. D for diameter:  Melanomas are usually (but not always) greater than 73m - the  size of a pencil eraser. E for evolution:  The spot keeps changing in size, shape, and color.  Please check your skin once per month between visits. You can use a small mirror in front and a large mirror behind you to keep an eye on the back side or your body.   If you see any new or changing lesions before your next follow-up, please call to schedule a visit.  Please continue daily skin protection including broad spectrum sunscreen SPF 30+ to sun-exposed areas, reapplying every 2 hours as needed when you're outdoors.    Staying in the shade or wearing long sleeves, sun glasses (UVA+UVB protection) and wide brim hats (4-inch brim around the entire circumference of the hat) are also recommended for sun protection.    Due to recent changes in healthcare laws, you may see results of your pathology and/or laboratory studies on MyChart before the doctors have had a chance to review them. We understand that in some cases there may be results that are confusing or concerning to you. Please understand that not all results are received at the same time and often the doctors may need to interpret multiple results in order to provide you with the best plan of care or course of treatment. Therefore, we ask that you please give [place] 2 business days to thoroughly review all your results before contacting the office for clarification. Should we see a critical lab result, you will be contacted sooner.   If You Need Anything After Your Visit  If you have any questions or concerns for your doctor, please call our main line at [phone] and press option 4 to reach your doctor's medical assistant. If no one answers, please leave a voicemail as directed and we will return your call as soon as possible. Messages left after 4 pm will be answered the following business day.   You may also send [place] a message via [place]. We typically respond to MyChart messages within 1-2 business days.  For prescription refills, please ask your pharmacy to contact our office. Our fax number is [phone].  If you have an urgent issue when the clinic is closed that cannot wait until the next business day, you can page your doctor at the number below.    Please note that while we do our best to be available for urgent issues outside of office hours, we are not available 24/7.   If you have an urgent issue and are unable to reach [place], you may choose to seek medical care at your doctor's office, retail clinic, urgent care center, or emergency room.  If  you have a medical emergency, please immediately call 911 or go to the emergency department.  Pager Numbers  - Dr. Nehemiah Massed: [phone]  - Dr. Laurence Ferrari: [phone]  - Dr. Nicole Kindred: [phone]  In the event of inclement weather, please call our main line at [phone] for an update on the status of any delays or closures.  Dermatology Medication Tips: Please keep the boxes that topical medications come in in order to help keep track of the instructions about where and how to use these. Pharmacies typically print the medication instructions only on the boxes and not directly on the medication tubes.   If your medication is too expensive, please contact our office at [phone] option 4 or send [place] a message through [place].   We are unable to tell what your co-pay for medications will be in advance as this is different depending on your  insurance coverage. However, we may be able to find a substitute medication at lower cost or fill out paperwork to get insurance to cover a needed medication.   If a prior authorization is required to get your medication covered by your insurance company, please allow [place] 1-2 business days to complete this process.  Drug prices often vary depending on where the prescription is filled and some pharmacies may offer cheaper prices.  The website www.goodrx.com contains coupons for medications through different pharmacies. The prices here do not account for what the cost may be with help from insurance (it may be cheaper with your insurance), but the website can give you the price if you did not use any insurance.  - You can print the associated coupon and take it with your prescription to the pharmacy.  - You may also stop by our office during regular business hours and pick up a GoodRx coupon card.  - If you need your prescription sent electronically to a different pharmacy, notify our office through [facility] or by phone at [phone] option  4.     Si Usted Necesita Algo Despus de Su Visita  Tambin puede enviarnos un mensaje a travs de Pharmacist, community. Por lo general respondemos a los mensajes de MyChart en el transcurso de 1 a 2 das hbiles.  Para renovar recetas, por favor pida a su farmacia que se ponga en contacto con nuestra oficina. Harland Dingwall de fax es [place] [phone].  Si tiene un asunto urgente cuando la clnica est cerrada y que no puede esperar hasta el siguiente da hbil, puede llamar/localizar a su doctor(a) al nmero que aparece a continuacin.   Por favor, tenga en cuenta que aunque hacemos todo lo posible para estar disponibles para asuntos urgentes fuera del horario de [place], no estamos disponibles las 24 horas del da, los 7 das de la [place].   Si tiene un problema urgente y no puede comunicarse con nosotros, puede optar por buscar atencin mdica  en el consultorio de su doctor(a), en una clnica privada, en un centro de atencin urgente o en una sala de emergencias.  Si tiene Engineering geologist, por favor llame inmediatamente al 911 o vaya a la sala de emergencias.  Nmeros de bper  - Dr. Nehemiah Massed: [phone]  - Dra. Moye: [phone]  - Dra. Nicole Kindred: [phone]  En caso de inclemencias del [place], por favor llame a Johnsie Kindred principal al [phone] para una actualizacin sobre el [place] de cualquier retraso o cierre.  Consejos para la medicacin en dermatologa: Por favor, guarde las cajas en las que vienen los medicamentos de uso tpico para ayudarle a seguir las instrucciones sobre dnde y cmo usarlos. Las farmacias generalmente imprimen las instrucciones del medicamento slo en las cajas y no directamente en los tubos del [place].   Si su medicamento es muy caro, por favor, pngase en contacto con Zigmund Daniel llamando al [phone] y presione la opcin 4 o envenos un mensaje a travs de Pharmacist, community.   No podemos decirle cul ser su copago por los medicamentos por  adelantado ya que esto es diferente dependiendo de la cobertura de su seguro. Sin embargo, es posible que podamos encontrar un medicamento sustituto a Electrical engineer un formulario para que el seguro cubra el medicamento que se considera necesario.   Si se requiere una autorizacin previa para que su compaa de seguros [place] su medicamento, por favor permtanos de 1 a 2 das hbiles para completar este proceso.  Los  precios de los medicamentos varan con frecuencia dependiendo del lugar de dnde se surte la receta y alguna farmacias pueden ofrecer precios ms baratos.  El sitio web www.goodrx.com tiene cupones para medicamentos de Airline pilot. Los precios aqu no tienen en cuenta lo que podra costar con la ayuda del seguro (puede ser ms barato con su seguro), pero el sitio web puede darle el precio si no utiliz Research scientist (physical sciences).  - Puede imprimir el cupn correspondiente y llevarlo con su receta a la farmacia.  - Tambin puede pasar por nuestra oficina durante el horario de atencin regular y Charity fundraiser una tarjeta de cupones de GoodRx.  - Si necesita que su receta se enve electrnicamente a una farmacia diferente, informe a nuestra oficina a travs de MyChart de [place] o por telfono llamando al [phone] y presione la opcin 4.

## 2022-11-05 NOTE — Progress Notes (Signed)
Follow-Up Visit   Subjective  Rachel Caldwell is a 51 y.o. female who presents for the following: Annual Exam (6 month tbse, hx of bcc hx of dysplastic, hx of isk).  She complains of rash on the breast that is currently not there but comes and goes. The patient presents for Total-Body Skin Exam (TBSE) for skin cancer screening and mole check.  The patient has spots, moles and lesions to be evaluated, some may be new or changing and the patient has concerns that these could be cancer.  The following portions of the chart were reviewed this encounter and updated as appropriate:  Tobacco  Allergies  Meds  Problems  Med Hx  Surg Hx  Fam Hx     Review of Systems: No other skin or systemic complaints except as noted in HPI or Assessment and Plan.  Objective  Well appearing patient in no apparent distress; mood and affect are within normal limits.  A full examination was performed including scalp, head, eyes, ears, nose, lips, neck, chest, axillae, abdomen, back, buttocks, bilateral upper extremities, bilateral lower extremities, hands, feet, fingers, toes, fingernails, and toenails. All findings within normal limits unless otherwise noted below.  right forearm x 1, left medial chest x 1 (2) Erythematous stuck-on, waxy papule or plaque  left medial breast x 1 Erythematous thin papules/macules with gritty scale.   Left Breast Scaly erythematous papules and patches +/- dyspigmentation, lichenification, excoriations.    Assessment & Plan  Inflamed seborrheic keratosis (2) right forearm x 1, left medial chest x 1 Symptomatic, irritating, patient would like treated.  Destruction of lesion - right forearm x 1, left medial chest x 1 Complexity: simple   Destruction method: cryotherapy   Informed consent: discussed and consent obtained   Timeout:  patient name, date of birth, surgical site, and procedure verified Lesion destroyed using liquid nitrogen: Yes   Region frozen until ice  ball extended beyond lesion: Yes   Outcome: patient tolerated procedure well with no complications   Post-procedure details: wound care instructions given   Additional details:  Prior to procedure, discussed risks of blister formation, small wound, skin dyspigmentation, or rare scar following cryotherapy. Recommend Vaseline ointment to treated areas while healing.  Actinic keratosis left medial breast x 1 Actinic keratoses are precancerous spots that appear secondary to cumulative UV radiation exposure/sun exposure over time. They are chronic with expected duration over 1 year. A portion of actinic keratoses will progress to squamous cell carcinoma of the skin. It is not possible to reliably predict which spots will progress to skin cancer and so treatment is recommended to prevent development of skin cancer.  Recommend daily broad spectrum sunscreen SPF 30+ to sun-exposed areas, reapply every 2 hours as needed.  Recommend staying in the shade or wearing long sleeves, sun glasses (UVA+UVB protection) and wide brim hats (4-inch brim around the entire circumference of the hat). Call for new or changing lesions.  Destruction of lesion - left medial breast x 1 Complexity: simple   Destruction method: cryotherapy   Informed consent: discussed and consent obtained   Timeout:  patient name, date of birth, surgical site, and procedure verified Lesion destroyed using liquid nitrogen: Yes   Region frozen until ice ball extended beyond lesion: Yes   Outcome: patient tolerated procedure well with no complications   Post-procedure details: wound care instructions given    Eczema,  By history -not present today but rash is red and scaly and itchy and comes and goes.  Left Breast Atopic dermatitis (eczema) is a chronic, relapsing, pruritic condition that can significantly affect quality of life. It is often associated with allergic rhinitis and/or asthma and can require treatment with topical medications,  phototherapy, or in severe cases biologic injectable medication (Dupixent; Adbry) or Oral JAK inhibitors.  Can start over the counter hydrocortisone 1 % cream apply topically to affected areas daily until rash is clear   Lentigines - Scattered tan macules - Due to sun exposure - Benign-appearing, observe - Recommend daily broad spectrum sunscreen SPF 30+ to sun-exposed areas, reapply every 2 hours as needed. - Call for any changes  Telangiectasia Nose and right forearm  - Dilated blood vessel - Benign appearing on exam - Call for changes  Seborrheic Keratoses - Stuck-on, waxy, tan-brown papules and/or plaques  - Benign-appearing - Discussed benign etiology and prognosis. - Observe - Call for any changes  Melanocytic Nevi - Tan-brown and/or pink-flesh-colored symmetric macules and papules - Benign appearing on exam today - Observation - Call clinic for new or changing moles - Recommend daily use of broad spectrum spf 30+ sunscreen to sun-exposed areas.   Hemangiomas - Red papules - Discussed benign nature - Observe - Call for any changes  Actinic Damage - Chronic condition, secondary to cumulative UV/sun exposure - diffuse scaly erythematous macules with underlying dyspigmentation - Recommend daily broad spectrum sunscreen SPF 30+ to sun-exposed areas, reapply every 2 hours as needed.  - Staying in the shade or wearing long sleeves, sun glasses (UVA+UVB protection) and wide brim hats (4-inch brim around the entire circumference of the hat) are also recommended for sun protection.  - Call for new or changing lesions.  History of Dysplastic Nevi Multiple locations see history  Left post thigh, mod, 2021. R mid to lower back 2.0cm lat to spine, severe atypia, exc 04/08/21.  Left sup pubic, severe atypia - excised 10/21/21  - No evidence of recurrence today - Recommend regular full body skin exams - Recommend daily broad spectrum sunscreen SPF 30+ to sun-exposed areas,  reapply every 2 hours as needed.  - Call if any new or changing lesions are noted between office visits  History of Basal Cell Carcinoma of the Skin Multiple locations see history  Basal Cell Carcinoma of the Skin. Left mid to med pretibial medial, 06/03/2020. Right post thigh, superficial, 07/23/2020  - No evidence of recurrence today - Recommend regular full body skin exams - Recommend daily broad spectrum sunscreen SPF 30+ to sun-exposed areas, reapply every 2 hours as needed.  - Call if any new or changing lesions are noted between office visits  Skin cancer screening performed today. Return in about 6 months (around 05/07/2023) for TBSE. IRuthell Rummage, CMA, am acting as scribe for Sarina Ser, MD. Documentation: I have reviewed the above documentation for accuracy and completeness, and I agree with the above.  Sarina Ser, MD

## 2022-11-10 ENCOUNTER — Ambulatory Visit: Payer: BC Managed Care – PPO | Admitting: Family Medicine

## 2022-11-10 ENCOUNTER — Encounter: Payer: Self-pay | Admitting: Family Medicine

## 2022-11-10 VITALS — BP 114/76 | HR 98 | Temp 98.2°F | Resp 16 | Ht 66.0 in | Wt 131.0 lb

## 2022-11-10 DIAGNOSIS — F411 Generalized anxiety disorder: Secondary | ICD-10-CM | POA: Diagnosis not present

## 2022-11-10 DIAGNOSIS — F439 Reaction to severe stress, unspecified: Secondary | ICD-10-CM | POA: Diagnosis not present

## 2022-11-10 DIAGNOSIS — N390 Urinary tract infection, site not specified: Secondary | ICD-10-CM | POA: Diagnosis not present

## 2022-11-10 DIAGNOSIS — R634 Abnormal weight loss: Secondary | ICD-10-CM

## 2022-11-10 DIAGNOSIS — R109 Unspecified abdominal pain: Secondary | ICD-10-CM | POA: Diagnosis not present

## 2022-11-10 DIAGNOSIS — Z13220 Encounter for screening for lipoid disorders: Secondary | ICD-10-CM

## 2022-11-10 DIAGNOSIS — R10A2 Flank pain, left side: Secondary | ICD-10-CM

## 2022-11-10 DIAGNOSIS — G8929 Other chronic pain: Secondary | ICD-10-CM

## 2022-11-10 MED ORDER — HYDROXYZINE HCL 10 MG PO TABS: 10.0000 mg | ORAL_TABLET | Freq: Three times a day (TID) | ORAL | 0 refills | Status: AC | PRN

## 2022-11-10 NOTE — Progress Notes (Signed)
Name: Rachel Caldwell   MRN: [id number]    DOB: 23-Mar-1971   Date:11/10/2022       Progress Note  Subjective  Chief Complaint  Fatigue/ Weightloss  HPI  GAD: husband asked her to come in to discuss her anxiety. She denies being sad or having lack of energy, but she has been feeling more anxious this year. They moved at the end of October, they bought their first house. Her mother that had a brain injury about 30 years ago and had change in mood , she calls her frequently and causes her extra stress. She started to lose weight , difficulty focusing at work over the past few months She has been taking celexa for years but does not seem to be helping anymore .  Weight loss unintentionally: explained it may be secondary to anxiety but we need to make sure nothing else is going on and needs to go back for colonoscopy and have labs done  Recurrent UTI: she continues to have urine odor, she was treated but symptoms not gone and we will recheck it today , she continues to have left flank pain for the past 2 weeks, discussed CT abdomen and pelvis to rule out hydronephrosis   Patient Active Problem List   Diagnosis Date Noted   Encounter for screening colonoscopy    Polyp of colon    H/O total hysterectomy 12/25/2020   History of basal cell cancer 12/25/2020   Mild intermittent asthma 05/11/2014   Generalized anxiety disorder 12/01/2007   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    Past Surgical History:  Procedure Laterality Date   ABDOMINAL HYSTERECTOMY  2005   COLONOSCOPY WITH PROPOFOL N/A 02/07/2021   Procedure: COLONOSCOPY WITH PROPOFOL;  Surgeon: Virgel Manifold, MD;  Location: [facility] ENDOSCOPY;  Service: Endoscopy;  Laterality: N/A;    Family History  Problem Relation Age of Onset   Hypercholesterolemia Mother    Heart block Mother        Put a Stent in   Hypertension Father    Breast cancer Neg Hx     Social History   Tobacco Use   Smoking status: Former    Packs/day: 0.50    Years:  12.00    Total pack years: 6.00    Types: Cigarettes    Start date: 11/24/1999    Quit date: 11/03/2012    Years since quitting: 10.0   Smokeless tobacco: Never  Substance Use Topics   Alcohol use: No    Alcohol/week: 0.0 standard drinks of alcohol     Current Outpatient Medications:    albuterol (VENTOLIN HFA) 108 (90 Base) MCG/ACT inhaler, INHALE 2 PUFFS INTO THE LUNGS EVERY 6 HOURS AS NEEDED FOR WHEEZING OR SHORTNESS OF BREATH, Disp: 18 g, Rfl: 1   citalopram (CELEXA) 20 MG tablet, Take 1 tablet (20 mg total) by mouth daily., Disp: 90 tablet, Rfl: 3   cloNIDine (CATAPRES) 0.1 MG tablet, Take 1 tablet (0.1 mg total) by mouth at bedtime., Disp: 90 tablet, Rfl: 3   doxycycline (ORACEA) 40 MG capsule, TAKE ONE CAPSULE BY MOUTH EVERY DAY WITH FOOD AND WATER, Disp: , Rfl:    EPIDUO FORTE 0.3-2.5 % GEL, APPLY thin layer TO FACE AT bedtime; [place] off IN THE MORNING], Disp: , Rfl:    nitrofurantoin, macrocrystal-monohydrate, (MACROBID) 100 MG capsule, Take 1 capsule (100 mg total) by mouth daily., Disp: 90 capsule, Rfl: 1   sulfamethoxazole-trimethoprim (BACTRIM) 400-80 MG tablet, Take 1 tablet by mouth 2 (two) times daily. Prn  UTI, Disp: 90 tablet, Rfl: 0  No Known Allergies  I personally reviewed active problem list, medication list, allergies, family history, social history, health maintenance with the patient/caregiver today.   ROS  Ten systems reviewed and is negative except as mentioned in HPI   Objective  Vitals:   11/10/22 1144  BP: 114/76  Pulse: 98  Resp: 16  Temp: 98.2 F (36.8 C)  SpO2: 100%  Weight: 131 lb (59.4 kg)  Height: '5\' 6"'$  (1.676 m)    Body mass index is 21.14 kg/m.  Physical Exam  Constitutional: Patient appears well-developed and well-nourished.  No distress.  HEENT: head atraumatic, normocephalic, pupils equal and reactive to light, neck supple Cardiovascular: Normal rate, regular rhythm and normal heart sounds.  No murmur heard. No BLE  edema. Pulmonary/Chest: Effort normal and breath sounds normal. No respiratory distress. Abdominal: Soft.  There is mild left CVA tenderness, no abdominal tenderness - discussed CT abdomen pelvis but she wants to hold off for now  Psychiatric: Patient has a normal mood and affect. behavior is normal. Judgment and thought content normal.   PHQ2/9:    11/10/2022   11:44 AM 10/26/2022    2:12 PM 02/10/2022    3:28 PM 12/25/2020    1:42 PM 08/15/2019    3:51 PM  Depression screen PHQ 2/9  Decreased Interest 0 0 0 0 0  Down, Depressed, Hopeless 0 0 0 0 0  PHQ - 2 Score 0 0 0 0 0  Altered sleeping 0 0 0  0  Tired, decreased energy 3 0 0  0  Change in appetite 0 0 0  0  Feeling bad or failure about yourself  0 0 0  0  Trouble concentrating 2 0 0  0  Moving slowly or fidgety/restless 0 0 0  0  Suicidal thoughts 0 0 0  0  PHQ-9 Score 5 0 0  0  Difficult doing work/chores     Not difficult at all    phq 9 is negative     11/10/2022   12:13 PM 08/10/2018    3:25 PM 02/07/2018    4:00 PM  GAD 7 : Generalized Anxiety Score  Nervous, Anxious, on Edge '3 1 2  '$ Control/stop worrying '3 1 3  '$ Worry too much - different things '3 1 3  '$ Trouble relaxing 3 0 2  Restless 3 0 2  Easily annoyed or irritable 0 1 1  Afraid - awful might happen 1 0   Total GAD 7 Score 16 4   Anxiety Difficulty Very difficult       Fall Risk:    11/10/2022   11:44 AM 10/26/2022    2:12 PM 02/10/2022    3:28 PM 12/25/2020    1:42 PM 08/15/2019    3:51 PM  Fall Risk   Falls in the past year? 0 0 0 0 0  Number falls in past yr: 0 0 0 0 0  Injury with Fall? 0 0 0 0 0  Risk for fall due to : No Fall Risks No Fall Risks No Fall Risks    Follow up Falls prevention discussed Falls prevention discussed Falls prevention discussed        Functional Status Survey: Is the patient deaf or have difficulty hearing?: No Does the patient have difficulty seeing, even when wearing glasses/contacts?: No Does the patient have  difficulty concentrating, remembering, or making decisions?: Yes Does the patient have difficulty walking or climbing stairs?: No Does the patient  have difficulty dressing or bathing?: No Does the patient have difficulty doing errands alone such as visiting a doctor's office or shopping?: No    Assessment & Plan  1. Generalized anxiety disorder  - hydrOXYzine (ATARAX) 10 MG tablet; Take 1-2 tablets (10-20 mg total) by mouth 3 (three) times daily as needed.  Dispense: 90 tablet; Refill: 0  2. Stress  - hydrOXYzine (ATARAX) 10 MG tablet; Take 1-2 tablets (10-20 mg total) by mouth 3 (three) times daily as needed.  Dispense: 90 tablet; Refill: 0  3. Weight loss, unintentional  - CBC with Differential/Platelet - Comprehensive metabolic panel - TSH  She is worried about cost of colonoscopy   4. Lipid screening  - Lipid panel  5. Recurrent UTI  - CULTURE, URINE COMPREHENSIVE   Urinalysis

## 2022-11-11 ENCOUNTER — Telehealth: Payer: Self-pay

## 2022-11-11 LAB — CBC WITH DIFFERENTIAL/PLATELET
Basophils Absolute: 0 10*3/uL (ref 0.0–0.2)
Basos: 1 %
EOS (ABSOLUTE): 0.1 10*3/uL (ref 0.0–0.4)
Eos: 2 %
Hematocrit: 39.9 % (ref 34.0–46.6)
Hemoglobin: 13.2 g/dL (ref 11.1–15.9)
Immature Grans (Abs): 0 10*3/uL (ref 0.0–0.1)
Immature Granulocytes: 0 %
Lymphocytes Absolute: 2.3 10*3/uL (ref 0.7–3.1)
Lymphs: 40 %
MCH: 31 pg (ref 26.6–33.0)
MCHC: 33.1 g/dL (ref 31.5–35.7)
MCV: 94 fL (ref 79–97)
Monocytes Absolute: 0.3 10*3/uL (ref 0.1–0.9)
Monocytes: 5 %
Neutrophils Absolute: 3.1 10*3/uL (ref 1.4–7.0)
Neutrophils: 52 %
Platelets: 273 10*3/uL (ref 150–450)
RBC: 4.26 x10E6/uL (ref 3.77–5.28)
RDW: 11.9 % (ref 11.7–15.4)
WBC: 5.8 10*3/uL (ref 3.4–10.8)

## 2022-11-11 LAB — URINALYSIS, COMPLETE
Bilirubin, UA: NEGATIVE
Glucose, UA: NEGATIVE
Leukocytes,UA: NEGATIVE
Nitrite, UA: NEGATIVE
Protein,UA: NEGATIVE
RBC, UA: NEGATIVE
Specific Gravity, UA: 1.012 (ref 1.005–1.030)
Urobilinogen, Ur: 0.2 mg/dL (ref 0.2–1.0)
pH, UA: 6 (ref 5.0–7.5)

## 2022-11-11 LAB — MICROSCOPIC EXAMINATION
Bacteria, UA: NONE SEEN
Casts: NONE SEEN /lpf

## 2022-11-11 LAB — TSH: TSH: 1.78 u[IU]/mL (ref 0.450–4.500)

## 2022-11-11 LAB — LIPID PANEL
Chol/HDL Ratio: 2.7 ratio (ref 0.0–4.4)
Cholesterol, Total: 198 mg/dL (ref 100–199)
HDL: 74 mg/dL (ref >39)
LDL Chol Calc (NIH): 111 mg/dL — ABNORMAL HIGH (ref 0–99)
Triglycerides: 74 mg/dL (ref 0–149)
VLDL Cholesterol Cal: 13 mg/dL (ref 5–40)

## 2022-11-11 LAB — COMPREHENSIVE METABOLIC PANEL
ALT: 10 IU/L (ref 0–32)
AST: 17 IU/L (ref 0–40)
Albumin/Globulin Ratio: 2.1 (ref 1.2–2.2)
Albumin: 4.7 g/dL (ref 3.9–4.9)
Alkaline Phosphatase: 64 IU/L (ref 44–121)
BUN/Creatinine Ratio: 10 (ref 9–23)
BUN: 9 mg/dL (ref 6–24)
Bilirubin Total: 0.4 mg/dL (ref 0.0–1.2)
CO2: 24 mmol/L (ref 20–29)
Calcium: 9.9 mg/dL (ref 8.7–10.2)
Chloride: 100 mmol/L (ref 96–106)
Creatinine, Ser: 0.94 mg/dL (ref 0.57–1.00)
Globulin, Total: 2.2 g/dL (ref 1.5–4.5)
Glucose: 106 mg/dL — ABNORMAL HIGH (ref 70–99)
Potassium: 4.1 mmol/L (ref 3.5–5.2)
Sodium: 139 mmol/L (ref 134–144)
Total Protein: 6.9 g/dL (ref 6.0–8.5)
eGFR: 74 mL/min/{1.73_m2} (ref >59)

## 2022-11-11 NOTE — Telephone Encounter (Signed)
Pt called for lab results. No result notes seen. Please would like a call back.

## 2022-11-13 LAB — CULTURE, URINE COMPREHENSIVE

## 2022-11-19 ENCOUNTER — Encounter: Payer: Self-pay | Admitting: Dermatology

## 2023-01-25 ENCOUNTER — Other Ambulatory Visit: Payer: Self-pay | Admitting: Family Medicine

## 2023-01-25 DIAGNOSIS — R61 Generalized hyperhidrosis: Secondary | ICD-10-CM

## 2023-01-25 DIAGNOSIS — J45909 Unspecified asthma, uncomplicated: Secondary | ICD-10-CM | POA: Diagnosis not present

## 2023-01-25 DIAGNOSIS — N390 Urinary tract infection, site not specified: Secondary | ICD-10-CM | POA: Diagnosis not present

## 2023-01-25 DIAGNOSIS — F411 Generalized anxiety disorder: Secondary | ICD-10-CM | POA: Diagnosis not present

## 2023-01-25 DIAGNOSIS — B3731 Acute candidiasis of vulva and vagina: Secondary | ICD-10-CM | POA: Diagnosis not present

## 2023-02-11 ENCOUNTER — Encounter: Payer: BC Managed Care – PPO | Admitting: Internal Medicine

## 2023-02-12 ENCOUNTER — Encounter: Payer: BC Managed Care – PPO | Admitting: Family Medicine

## 2023-04-06 DIAGNOSIS — R8290 Unspecified abnormal findings in urine: Secondary | ICD-10-CM | POA: Diagnosis not present

## 2023-04-06 DIAGNOSIS — B3731 Acute candidiasis of vulva and vagina: Secondary | ICD-10-CM | POA: Diagnosis not present

## 2023-04-06 DIAGNOSIS — R3914 Feeling of incomplete bladder emptying: Secondary | ICD-10-CM | POA: Diagnosis not present

## 2023-04-06 DIAGNOSIS — N390 Urinary tract infection, site not specified: Secondary | ICD-10-CM | POA: Diagnosis not present

## 2023-04-12 ENCOUNTER — Other Ambulatory Visit: Payer: Self-pay | Admitting: Family Medicine

## 2023-04-12 DIAGNOSIS — N390 Urinary tract infection, site not specified: Secondary | ICD-10-CM

## 2023-04-13 NOTE — Telephone Encounter (Signed)
Lvm for pt to return call to schedule appt. I also see your no longer listed as her pcp so I also asked her to return call to clarify if she is still a patient here

## 2023-04-26 DIAGNOSIS — L578 Other skin changes due to chronic exposure to nonionizing radiation: Secondary | ICD-10-CM | POA: Diagnosis not present

## 2023-04-26 DIAGNOSIS — L700 Acne vulgaris: Secondary | ICD-10-CM | POA: Diagnosis not present

## 2023-04-26 DIAGNOSIS — W908XXS Exposure to other nonionizing radiation, sequela: Secondary | ICD-10-CM | POA: Diagnosis not present

## 2023-04-26 DIAGNOSIS — D0461 Carcinoma in situ of skin of right upper limb, including shoulder: Secondary | ICD-10-CM | POA: Diagnosis not present

## 2023-04-26 DIAGNOSIS — D2272 Melanocytic nevi of left lower limb, including hip: Secondary | ICD-10-CM | POA: Diagnosis not present

## 2023-05-20 ENCOUNTER — Ambulatory Visit: Payer: BC Managed Care – PPO | Admitting: Dermatology

## 2023-06-10 DIAGNOSIS — N390 Urinary tract infection, site not specified: Secondary | ICD-10-CM | POA: Diagnosis not present

## 2023-07-28 DIAGNOSIS — Z0000 Encounter for general adult medical examination without abnormal findings: Secondary | ICD-10-CM | POA: Diagnosis not present

## 2023-07-28 DIAGNOSIS — Z23 Encounter for immunization: Secondary | ICD-10-CM | POA: Diagnosis not present

## 2023-07-28 DIAGNOSIS — Z13220 Encounter for screening for lipoid disorders: Secondary | ICD-10-CM | POA: Diagnosis not present

## 2023-07-28 DIAGNOSIS — Z1329 Encounter for screening for other suspected endocrine disorder: Secondary | ICD-10-CM | POA: Diagnosis not present

## 2023-09-06 DIAGNOSIS — R92333 Mammographic heterogeneous density, bilateral breasts: Secondary | ICD-10-CM | POA: Diagnosis not present

## 2023-09-06 DIAGNOSIS — Z1231 Encounter for screening mammogram for malignant neoplasm of breast: Secondary | ICD-10-CM | POA: Diagnosis not present

## 2023-10-13 DIAGNOSIS — Z79899 Other long term (current) drug therapy: Secondary | ICD-10-CM | POA: Diagnosis not present

## 2023-10-13 DIAGNOSIS — R1031 Right lower quadrant pain: Secondary | ICD-10-CM | POA: Diagnosis not present

## 2023-10-13 DIAGNOSIS — F419 Anxiety disorder, unspecified: Secondary | ICD-10-CM | POA: Diagnosis not present

## 2023-10-13 DIAGNOSIS — A09 Infectious gastroenteritis and colitis, unspecified: Secondary | ICD-10-CM | POA: Diagnosis not present

## 2023-10-13 DIAGNOSIS — Z9889 Other specified postprocedural states: Secondary | ICD-10-CM | POA: Diagnosis not present

## 2023-10-13 DIAGNOSIS — Z87891 Personal history of nicotine dependence: Secondary | ICD-10-CM | POA: Diagnosis not present

## 2023-10-13 DIAGNOSIS — Z87440 Personal history of urinary (tract) infections: Secondary | ICD-10-CM | POA: Diagnosis not present

## 2023-10-13 DIAGNOSIS — J45909 Unspecified asthma, uncomplicated: Secondary | ICD-10-CM | POA: Diagnosis not present

## 2023-10-13 DIAGNOSIS — K581 Irritable bowel syndrome with constipation: Secondary | ICD-10-CM | POA: Diagnosis not present

## 2023-10-13 DIAGNOSIS — Z7951 Long term (current) use of inhaled steroids: Secondary | ICD-10-CM | POA: Diagnosis not present

## 2023-10-13 DIAGNOSIS — Z98891 History of uterine scar from previous surgery: Secondary | ICD-10-CM | POA: Diagnosis not present

## 2023-11-03 DIAGNOSIS — Z860101 Personal history of adenomatous and serrated colon polyps: Secondary | ICD-10-CM | POA: Diagnosis not present

## 2023-11-03 DIAGNOSIS — R935 Abnormal findings on diagnostic imaging of other abdominal regions, including retroperitoneum: Secondary | ICD-10-CM | POA: Diagnosis not present

## 2023-11-03 DIAGNOSIS — K5909 Other constipation: Secondary | ICD-10-CM | POA: Diagnosis not present

## 2023-11-10 DIAGNOSIS — N3090 Cystitis, unspecified without hematuria: Secondary | ICD-10-CM | POA: Diagnosis not present

## 2023-11-10 DIAGNOSIS — R399 Unspecified symptoms and signs involving the genitourinary system: Secondary | ICD-10-CM | POA: Diagnosis not present
